# Patient Record
Sex: Female | Born: 1940 | Race: White | Hispanic: No | Smoking: Never smoker
Health system: Southern US, Community
[De-identification: ages and names within clinical notes are randomized; demographics above are authoritative.]

## PROBLEM LIST (undated history)

## (undated) DIAGNOSIS — R42 Dizziness and giddiness: Secondary | ICD-10-CM

## (undated) DIAGNOSIS — G459 Transient cerebral ischemic attack, unspecified: Secondary | ICD-10-CM

## (undated) DIAGNOSIS — E7800 Pure hypercholesterolemia, unspecified: Secondary | ICD-10-CM

## (undated) DIAGNOSIS — F419 Anxiety disorder, unspecified: Secondary | ICD-10-CM

## (undated) DIAGNOSIS — I10 Essential (primary) hypertension: Secondary | ICD-10-CM

## (undated) DIAGNOSIS — K859 Acute pancreatitis without necrosis or infection, unspecified: Secondary | ICD-10-CM

## (undated) DIAGNOSIS — E079 Disorder of thyroid, unspecified: Secondary | ICD-10-CM

## (undated) DIAGNOSIS — K56609 Unspecified intestinal obstruction, unspecified as to partial versus complete obstruction: Secondary | ICD-10-CM

## (undated) DIAGNOSIS — E119 Type 2 diabetes mellitus without complications: Secondary | ICD-10-CM

## (undated) HISTORY — PX: CHOLECYSTECTOMY: SHX55

## (undated) HISTORY — PX: OTHER SURGICAL HISTORY: SHX169

## (undated) HISTORY — PX: ABDOMINAL HYSTERECTOMY: SHX81

## (undated) HISTORY — PX: APPENDECTOMY: SHX54

## (undated) HISTORY — PX: TONSILLECTOMY: SUR1361

---

## 2010-03-17 ENCOUNTER — Ambulatory Visit: Payer: Self-pay | Admitting: Family Medicine

## 2010-03-17 ENCOUNTER — Inpatient Hospital Stay (HOSPITAL_COMMUNITY): Admission: EM | Admit: 2010-03-17 | Discharge: 2010-03-21 | Payer: Self-pay | Admitting: Emergency Medicine

## 2010-03-21 ENCOUNTER — Ambulatory Visit: Payer: Self-pay | Admitting: Psychiatry

## 2010-09-29 LAB — CBC
HCT: 32.1 % — ABNORMAL LOW (ref 36.0–46.0)
MCH: 30 pg (ref 26.0–34.0)
MCHC: 34.3 g/dL (ref 30.0–36.0)
MCV: 88.7 fL (ref 78.0–100.0)
MCV: 89.2 fL (ref 78.0–100.0)
MCV: 89.9 fL (ref 78.0–100.0)
Platelets: 176 10*3/uL (ref 150–400)
Platelets: 199 10*3/uL (ref 150–400)
Platelets: 213 10*3/uL (ref 150–400)
RBC: 3.36 MIL/uL — ABNORMAL LOW (ref 3.87–5.11)
RBC: 3.6 MIL/uL — ABNORMAL LOW (ref 3.87–5.11)
RDW: 12.9 % (ref 11.5–15.5)
RDW: 12.9 % (ref 11.5–15.5)
RDW: 13 % (ref 11.5–15.5)
WBC: 4.3 10*3/uL (ref 4.0–10.5)
WBC: 6.1 10*3/uL (ref 4.0–10.5)

## 2010-09-29 LAB — URINALYSIS, ROUTINE W REFLEX MICROSCOPIC
Glucose, UA: NEGATIVE mg/dL
Nitrite: NEGATIVE
Specific Gravity, Urine: 1.008 (ref 1.005–1.030)
pH: 5 (ref 5.0–8.0)

## 2010-09-29 LAB — BASIC METABOLIC PANEL
BUN: 12 mg/dL (ref 6–23)
BUN: 12 mg/dL (ref 6–23)
BUN: 12 mg/dL (ref 6–23)
CO2: 27 mEq/L (ref 19–32)
Chloride: 100 mEq/L (ref 96–112)
Chloride: 103 mEq/L (ref 96–112)
Chloride: 99 mEq/L (ref 96–112)
Creatinine, Ser: 1.1 mg/dL (ref 0.4–1.2)
Creatinine, Ser: 1.29 mg/dL — ABNORMAL HIGH (ref 0.4–1.2)
Creatinine, Ser: 1.46 mg/dL — ABNORMAL HIGH (ref 0.4–1.2)
GFR calc Af Amer: 59 mL/min — ABNORMAL LOW (ref >60)
GFR calc non Af Amer: 49 mL/min — ABNORMAL LOW (ref >60)
Potassium: 4.2 mEq/L (ref 3.5–5.1)

## 2010-09-29 LAB — CLOSTRIDIUM DIFFICILE EIA

## 2010-09-29 LAB — URINE CULTURE: Culture  Setup Time: 201109011720

## 2010-09-29 LAB — DIFFERENTIAL
Basophils Absolute: 0.1 10*3/uL (ref 0.0–0.1)
Basophils Relative: 1 % (ref 0–1)
Eosinophils Absolute: 0.3 10*3/uL (ref 0.0–0.7)
Eosinophils Relative: 5 % (ref 0–5)
Lymphocytes Relative: 34 % (ref 12–46)

## 2010-09-29 LAB — T3: T3, Total: 88.4 ng/dl (ref 80.0–204.0)

## 2010-09-29 LAB — VITAMIN B12: Vitamin B-12: 226 pg/mL (ref 211–911)

## 2010-09-29 LAB — T4, FREE: Free T4: 0.96 ng/dL (ref 0.80–1.80)

## 2010-09-29 LAB — GLUCOSE, CAPILLARY: Glucose-Capillary: 132 mg/dL — ABNORMAL HIGH (ref 70–99)

## 2010-09-29 LAB — T4: T4, Total: 7.2 ug/dL (ref 5.0–12.5)

## 2010-09-29 LAB — PROTIME-INR: Prothrombin Time: 13.7 seconds (ref 11.6–15.2)

## 2010-09-29 LAB — POCT CARDIAC MARKERS

## 2012-10-29 ENCOUNTER — Emergency Department (HOSPITAL_BASED_OUTPATIENT_CLINIC_OR_DEPARTMENT_OTHER)
Admission: EM | Admit: 2012-10-29 | Discharge: 2012-10-29 | Disposition: A | Discharge status: Home / Self Care | Payer: Medicare Other | Attending: Emergency Medicine | Admitting: Emergency Medicine

## 2012-10-29 ENCOUNTER — Emergency Department (HOSPITAL_BASED_OUTPATIENT_CLINIC_OR_DEPARTMENT_OTHER): Payer: Medicare Other

## 2012-10-29 ENCOUNTER — Encounter (HOSPITAL_BASED_OUTPATIENT_CLINIC_OR_DEPARTMENT_OTHER): Payer: Self-pay | Admitting: *Deleted

## 2012-10-29 DIAGNOSIS — F411 Generalized anxiety disorder: Secondary | ICD-10-CM | POA: Insufficient documentation

## 2012-10-29 DIAGNOSIS — Y9301 Activity, walking, marching and hiking: Secondary | ICD-10-CM | POA: Insufficient documentation

## 2012-10-29 DIAGNOSIS — R110 Nausea: Secondary | ICD-10-CM | POA: Insufficient documentation

## 2012-10-29 DIAGNOSIS — Y929 Unspecified place or not applicable: Secondary | ICD-10-CM | POA: Insufficient documentation

## 2012-10-29 DIAGNOSIS — E079 Disorder of thyroid, unspecified: Secondary | ICD-10-CM | POA: Insufficient documentation

## 2012-10-29 DIAGNOSIS — R51 Headache: Secondary | ICD-10-CM | POA: Insufficient documentation

## 2012-10-29 DIAGNOSIS — Z8639 Personal history of other endocrine, nutritional and metabolic disease: Secondary | ICD-10-CM | POA: Insufficient documentation

## 2012-10-29 DIAGNOSIS — R109 Unspecified abdominal pain: Secondary | ICD-10-CM | POA: Insufficient documentation

## 2012-10-29 DIAGNOSIS — R5381 Other malaise: Secondary | ICD-10-CM | POA: Insufficient documentation

## 2012-10-29 DIAGNOSIS — G8929 Other chronic pain: Secondary | ICD-10-CM | POA: Insufficient documentation

## 2012-10-29 DIAGNOSIS — IMO0002 Reserved for concepts with insufficient information to code with codable children: Secondary | ICD-10-CM | POA: Insufficient documentation

## 2012-10-29 DIAGNOSIS — I10 Essential (primary) hypertension: Secondary | ICD-10-CM | POA: Insufficient documentation

## 2012-10-29 DIAGNOSIS — Z79899 Other long term (current) drug therapy: Secondary | ICD-10-CM | POA: Insufficient documentation

## 2012-10-29 DIAGNOSIS — K439 Ventral hernia without obstruction or gangrene: Secondary | ICD-10-CM

## 2012-10-29 DIAGNOSIS — W1809XA Striking against other object with subsequent fall, initial encounter: Secondary | ICD-10-CM | POA: Insufficient documentation

## 2012-10-29 DIAGNOSIS — Z8673 Personal history of transient ischemic attack (TIA), and cerebral infarction without residual deficits: Secondary | ICD-10-CM | POA: Insufficient documentation

## 2012-10-29 DIAGNOSIS — Z862 Personal history of diseases of the blood and blood-forming organs and certain disorders involving the immune mechanism: Secondary | ICD-10-CM | POA: Insufficient documentation

## 2012-10-29 DIAGNOSIS — R42 Dizziness and giddiness: Secondary | ICD-10-CM

## 2012-10-29 DIAGNOSIS — Z8719 Personal history of other diseases of the digestive system: Secondary | ICD-10-CM | POA: Insufficient documentation

## 2012-10-29 DIAGNOSIS — R300 Dysuria: Secondary | ICD-10-CM | POA: Insufficient documentation

## 2012-10-29 HISTORY — DX: Transient cerebral ischemic attack, unspecified: G45.9

## 2012-10-29 HISTORY — DX: Pure hypercholesterolemia, unspecified: E78.00

## 2012-10-29 HISTORY — DX: Disorder of thyroid, unspecified: E07.9

## 2012-10-29 HISTORY — DX: Essential (primary) hypertension: I10

## 2012-10-29 HISTORY — DX: Dizziness and giddiness: R42

## 2012-10-29 HISTORY — DX: Anxiety disorder, unspecified: F41.9

## 2012-10-29 HISTORY — DX: Acute pancreatitis without necrosis or infection, unspecified: K85.90

## 2012-10-29 LAB — COMPREHENSIVE METABOLIC PANEL
ALT: 21 U/L (ref 0–35)
Alkaline Phosphatase: 72 U/L (ref 39–117)
CO2: 22 mEq/L (ref 19–32)
Chloride: 95 mEq/L — ABNORMAL LOW (ref 96–112)
GFR calc Af Amer: 64 mL/min — ABNORMAL LOW (ref >90)
GFR calc non Af Amer: 55 mL/min — ABNORMAL LOW (ref >90)
Glucose, Bld: 229 mg/dL — ABNORMAL HIGH (ref 70–99)
Potassium: 3.8 mEq/L (ref 3.5–5.1)
Sodium: 130 mEq/L — ABNORMAL LOW (ref 135–145)

## 2012-10-29 LAB — URINALYSIS, ROUTINE W REFLEX MICROSCOPIC
Glucose, UA: 250 mg/dL — AB
Hgb urine dipstick: NEGATIVE
Protein, ur: NEGATIVE mg/dL
Specific Gravity, Urine: 1.01 (ref 1.005–1.030)
pH: 5.5 (ref 5.0–8.0)

## 2012-10-29 LAB — CBC
MCHC: 34.3 g/dL (ref 30.0–36.0)
RDW: 13.1 % (ref 11.5–15.5)

## 2012-10-29 LAB — LIPASE, BLOOD: Lipase: 49 U/L (ref 11–59)

## 2012-10-29 LAB — TROPONIN I: Troponin I: <0.3 ng/mL (ref <0.30)

## 2012-10-29 LAB — GLUCOSE, CAPILLARY: Glucose-Capillary: 227 mg/dL — ABNORMAL HIGH (ref 70–99)

## 2012-10-29 MED ORDER — MECLIZINE HCL 25 MG PO TABS
25.0000 mg | ORAL_TABLET | Freq: Once | ORAL | Status: AC
  Administered 2012-10-29: 25 mg via ORAL
  Filled 2012-10-29: qty 1

## 2012-10-29 MED ORDER — IOHEXOL 300 MG/ML  SOLN
50.0000 mL | Freq: Once | INTRAMUSCULAR | Status: AC | PRN
  Administered 2012-10-29: 50 mL via ORAL

## 2012-10-29 MED ORDER — ONDANSETRON HCL 4 MG/2ML IJ SOLN
4.0000 mg | Freq: Once | INTRAMUSCULAR | Status: AC
  Administered 2012-10-29: 4 mg via INTRAVENOUS
  Filled 2012-10-29: qty 2

## 2012-10-29 MED ORDER — IOHEXOL 300 MG/ML  SOLN
100.0000 mL | Freq: Once | INTRAMUSCULAR | Status: AC | PRN
  Administered 2012-10-29: 100 mL via INTRAVENOUS

## 2012-10-29 MED ORDER — ONDANSETRON 4 MG PO TBDP: 4.0000 mg | ORAL_TABLET | Freq: Once | ORAL | Status: DC

## 2012-10-29 MED ORDER — GADOBENATE DIMEGLUMINE 529 MG/ML IV SOLN: 15.0000 mL | Freq: Once | INTRAVENOUS | Status: DC | PRN

## 2012-10-29 MED ORDER — SODIUM CHLORIDE 0.9 % IV BOLUS (SEPSIS)
500.0000 mL | Freq: Once | INTRAVENOUS | Status: AC
  Administered 2012-10-29: 500 mL via INTRAVENOUS

## 2012-10-29 NOTE — ED Notes (Signed)
Pt. Has had apple juice and was able to keep it down.   Pt. Up to restroom and is noted unsteady on her feet.  Pt. Reports she has vertigo.   Pt. Taken to restroom with steady just now.   Pt. Tolerated well.  Pt. Sons at bedside.

## 2012-10-29 NOTE — ED Provider Notes (Signed)
History     CSN: [id number]  Arrival date & time 10/29/12  1009   First MD Initiated Contact with Patient 10/29/12 1023      Chief Complaint  Patient presents with  . Dizziness    (Consider location/radiation/quality/duration/timing/severity/associated sxs/prior treatment) HPI  Patient presents complaining of worsening of chronic dizziness, dysuria, weakness, abdominal pain and back pain.  She is a poor historian.  She says she fell against a wall on her left side yesterday while walking.  She is nauseated and has not been eating well.  She has difficulty explaining exactly why she came to the ER today.    She has chronic vertigo and takes meclizine TID but says it is a little worse lately.  She has chronic abdominal pain and history of pancreatitis, with intermittent nausea, but says it is a little worse.    Past Medical History  Diagnosis Date  . Anxiety   . Hypertension   . Elevated cholesterol   . TIA (transient ischemic attack)   . Thyroid disease   . Pancreatitis   . Vertigo   Says they have tried to say she has diabetes but every time they give her medications it drops her sugar.   Past Surgical History  Procedure Laterality Date  . Abdominal hysterectomy    . Cholecystectomy    . Tonsillectomy    . Appendectomy      History  Substance Use Topics  . Smoking status: Never Smoker   . Smokeless tobacco: Never Used  . Alcohol Use: No    Review of Systems  Constitutional: Positive for fatigue.  HENT: Negative for neck pain.   Eyes: Negative for visual disturbance.  Respiratory: Negative for cough.   Cardiovascular: Negative for chest pain.  Gastrointestinal: Positive for nausea and abdominal pain. Negative for vomiting.  Genitourinary: Positive for dysuria.  Musculoskeletal: Positive for back pain.  Skin: Negative for rash.  Neurological: Positive for dizziness and headaches. Negative for syncope.    Allergies  Metformin and related  Home Medications    Current Outpatient Rx  Name  Route  Sig  Dispense  Refill  . ALPRAZolam (XANAX) 1 MG tablet   Oral   Take 1 mg by mouth at bedtime as needed for sleep.         . citalopram (CELEXA) 40 MG tablet   Oral   Take 40 mg by mouth daily.         . hydrOXYzine (ATARAX/VISTARIL) 25 MG tablet   Oral   Take 25 mg by mouth 3 (three) times daily as needed for itching.         . levothyroxine (SYNTHROID, LEVOTHROID) 100 MCG tablet   Oral   Take 100 mcg by mouth daily before breakfast.         . meclizine (ANTIVERT) 25 MG tablet   Oral   Take 25 mg by mouth 3 (three) times daily.         Marland Kitchen omeprazole (PRILOSEC) 20 MG capsule   Oral   Take 20 mg by mouth daily.         . simvastatin (ZOCOR) 80 MG tablet   Oral   Take 80 mg by mouth at bedtime.         . triamterene-hydrochlorothiazide (MAXZIDE-25) 37.5-25 MG per tablet   Oral   Take 1 tablet by mouth daily.           BP 156/65  Pulse 66  Temp(Src) 98.3 F (36.8 C) (Oral)  Resp 18  SpO2 100%  Physical Exam  Vitals reviewed. Constitutional: She is oriented to person, place, and time. She appears well-developed and well-nourished.  HENT:  Head: Normocephalic and atraumatic.  Mouth/Throat: Oropharynx is clear and moist.  Eyes: Pupils are equal, round, and reactive to light.  Cardiovascular: Normal rate and regular rhythm.   Pulmonary/Chest: Effort normal and breath sounds normal.  Abdominal: Soft.  Diffuse tenderness to palpation, no rebound  Musculoskeletal:  + tenderness to palpation over shoulder diffusely, no deformity  Neurological: She is alert and oriented to person, place, and time. No cranial nerve deficit.    ED Course  Procedures (including critical care time)  Labs Reviewed  GLUCOSE, CAPILLARY - Abnormal; Notable for the following:    Glucose-Capillary 227 (*)    All other components within normal limits  URINALYSIS, ROUTINE W REFLEX MICROSCOPIC - Abnormal; Notable for the following:     Glucose, UA 250 (*)    All other components within normal limits  COMPREHENSIVE METABOLIC PANEL - Abnormal; Notable for the following:    Sodium 130 (*)    Chloride 95 (*)    Glucose, Bld 229 (*)    Total Bilirubin 0.2 (*)    GFR calc non Af Amer 55 (*)    GFR calc Af Amer 64 (*)    All other components within normal limits  CBC - Abnormal; Notable for the following:    RBC 3.61 (*)    Hemoglobin 11.0 (*)    HCT 32.1 (*)    All other components within normal limits  LIPASE, BLOOD  TROPONIN I  LACTIC ACID, PLASMA   Dg Chest 2 View  10/29/2012  *RADIOLOGY REPORT*  Clinical Data: Dizziness.  Status post fall with a blow to the left shoulder.  CHEST - 2 VIEW  Comparison: PA and lateral chest 03/17/2010.  Findings: There is cardiomegaly without edema.  Lungs are clear. No pneumothorax or pleural effusion.  No focal bony abnormality.  IMPRESSION: Cardiomegaly without acute disease.   Original Report Authenticated By: Holley Dexter, M.D.    Mr Angiogram Head Wo Contrast  10/29/2012  *RADIOLOGY REPORT*  Clinical Data:  Dizziness.  Vertigo.  Elevated blood sugar. Headache.  Right arm pain.  Hypertensive patient with hypercholesterolemia.  MRI BRAIN WITH AND WITHOUT CONTRAST MRA HEAD WITHOUT CONTRAST MRA NECK WITHOUT AND WITH CONTRAST  Technique: Multiplanar, multiecho pulse sequences of the brain and surrounding structures were obtained according to standard protocol with and without intravenous contrast.  Angiographic images of the Circle of Willis were obtained using MRA technique without intravenous contrast.  Angiographic images of the neck were obtained using MRA technique without and with intravenous contrast.  Contrast:15 ml MultiHance.  Comparison:03/18/2010 MR brain.  MRI HEAD  Findings: No acute infarct.  No intracranial hemorrhage.  Mild small vessel disease type changes.  Prominent perivascular space right basal ganglia incidentally noted and unchanged.  No hydrocephalus.  No  intracranial mass or abnormal enhancement.  Cervical medullary junction unremarkable.  IMPRESSION: No acute abnormality.  Please see above.  MRA HEAD  Findings:Anterior circulation without medium or large size vessel significant stenosis or occlusion.  Left internal carotid artery petrous segment with mild ectasia posterior margin.  Fetal type origin of the right posterior cerebral artery.  Tiny bulge distal M1 segment right middle cerebral artery represents a vessel origin as seen on source sequence.  No discrete saccular aneurysm noted.  Right vertebral artery is dominant.  The course of the PICAs are not included  on the present exam.  Prominent AICAs.  Smooth narrowing of the basilar artery after the takeoff of the AICAs.  Mild bulbous appearance of the basilar tip without discrete aneurysm.  Moderate narrowing and irregularity of the left superior cerebellar artery.  Posterior cerebral artery branch vessel irregularity bilaterally.  IMPRESSION: Intracranial atherosclerotic type changes as noted above.  MRA NECK  Findings:Carotid bifurcation plaque with narrowing of the proximal internal carotid artery but without evidence of hemodynamically significant stenosis  Left vertebral artery arises directly from the aortic arch.  Right vertebral artery is dominant.  Mild irregularity of the vertebral arteries without high-grade stenosis.  IMPRESSION: No evidence of hemodynamically significant stenosis involving either carotid bifurcation.  Left vertebral artery arises directly from the aortic arch.  Right vertebral artery is dominant.  Mild irregularity of the vertebral arteries without high-grade stenosis.   Original Report Authenticated By: Lacy Duverney, M.D.    Mr Angiogram Neck W Wo Contrast  10/29/2012  *RADIOLOGY REPORT*  Clinical Data:  Dizziness.  Vertigo.  Elevated blood sugar. Headache.  Right arm pain.  Hypertensive patient with hypercholesterolemia.  MRI BRAIN WITH AND WITHOUT CONTRAST MRA HEAD WITHOUT  CONTRAST MRA NECK WITHOUT AND WITH CONTRAST  Technique: Multiplanar, multiecho pulse sequences of the brain and surrounding structures were obtained according to standard protocol with and without intravenous contrast.  Angiographic images of the Circle of Willis were obtained using MRA technique without intravenous contrast.  Angiographic images of the neck were obtained using MRA technique without and with intravenous contrast.  Contrast:15 ml MultiHance.  Comparison:03/18/2010 MR brain.  MRI HEAD  Findings: No acute infarct.  No intracranial hemorrhage.  Mild small vessel disease type changes.  Prominent perivascular space right basal ganglia incidentally noted and unchanged.  No hydrocephalus.  No intracranial mass or abnormal enhancement.  Cervical medullary junction unremarkable.  IMPRESSION: No acute abnormality.  Please see above.  MRA HEAD  Findings:Anterior circulation without medium or large size vessel significant stenosis or occlusion.  Left internal carotid artery petrous segment with mild ectasia posterior margin.  Fetal type origin of the right posterior cerebral artery.  Tiny bulge distal M1 segment right middle cerebral artery represents a vessel origin as seen on source sequence.  No discrete saccular aneurysm noted.  Right vertebral artery is dominant.  The course of the PICAs are not included on the present exam.  Prominent AICAs.  Smooth narrowing of the basilar artery after the takeoff of the AICAs.  Mild bulbous appearance of the basilar tip without discrete aneurysm.  Moderate narrowing and irregularity of the left superior cerebellar artery.  Posterior cerebral artery branch vessel irregularity bilaterally.  IMPRESSION: Intracranial atherosclerotic type changes as noted above.  MRA NECK  Findings:Carotid bifurcation plaque with narrowing of the proximal internal carotid artery but without evidence of hemodynamically significant stenosis  Left vertebral artery arises directly from the aortic  arch.  Right vertebral artery is dominant.  Mild irregularity of the vertebral arteries without high-grade stenosis.  IMPRESSION: No evidence of hemodynamically significant stenosis involving either carotid bifurcation.  Left vertebral artery arises directly from the aortic arch.  Right vertebral artery is dominant.  Mild irregularity of the vertebral arteries without high-grade stenosis.   Original Report Authenticated By: Lacy Duverney, M.D.    Mr Laqueta Jean ZO Contrast  10/29/2012  *RADIOLOGY REPORT*  Clinical Data:  Dizziness.  Vertigo.  Elevated blood sugar. Headache.  Right arm pain.  Hypertensive patient with hypercholesterolemia.  MRI BRAIN WITH AND WITHOUT CONTRAST MRA HEAD  WITHOUT CONTRAST MRA NECK WITHOUT AND WITH CONTRAST  Technique: Multiplanar, multiecho pulse sequences of the brain and surrounding structures were obtained according to standard protocol with and without intravenous contrast.  Angiographic images of the Circle of Willis were obtained using MRA technique without intravenous contrast.  Angiographic images of the neck were obtained using MRA technique without and with intravenous contrast.  Contrast:15 ml MultiHance.  Comparison:03/18/2010 MR brain.  MRI HEAD  Findings: No acute infarct.  No intracranial hemorrhage.  Mild small vessel disease type changes.  Prominent perivascular space right basal ganglia incidentally noted and unchanged.  No hydrocephalus.  No intracranial mass or abnormal enhancement.  Cervical medullary junction unremarkable.  IMPRESSION: No acute abnormality.  Please see above.  MRA HEAD  Findings:Anterior circulation without medium or large size vessel significant stenosis or occlusion.  Left internal carotid artery petrous segment with mild ectasia posterior margin.  Fetal type origin of the right posterior cerebral artery.  Tiny bulge distal M1 segment right middle cerebral artery represents a vessel origin as seen on source sequence.  No discrete saccular aneurysm  noted.  Right vertebral artery is dominant.  The course of the PICAs are not included on the present exam.  Prominent AICAs.  Smooth narrowing of the basilar artery after the takeoff of the AICAs.  Mild bulbous appearance of the basilar tip without discrete aneurysm.  Moderate narrowing and irregularity of the left superior cerebellar artery.  Posterior cerebral artery branch vessel irregularity bilaterally.  IMPRESSION: Intracranial atherosclerotic type changes as noted above.  MRA NECK  Findings:Carotid bifurcation plaque with narrowing of the proximal internal carotid artery but without evidence of hemodynamically significant stenosis  Left vertebral artery arises directly from the aortic arch.  Right vertebral artery is dominant.  Mild irregularity of the vertebral arteries without high-grade stenosis.  IMPRESSION: No evidence of hemodynamically significant stenosis involving either carotid bifurcation.  Left vertebral artery arises directly from the aortic arch.  Right vertebral artery is dominant.  Mild irregularity of the vertebral arteries without high-grade stenosis.   Original Report Authenticated By: Lacy Duverney, M.D.    Ct Abdomen Pelvis W Contrast  10/29/2012  *RADIOLOGY REPORT*  Clinical Data: Right-sided abdominal and back pain.  Nausea. History pancreatitis.  Previous cholecystectomy and appendectomy.  CT ABDOMEN AND PELVIS WITH CONTRAST  Technique:  Multidetector CT imaging of the abdomen and pelvis was performed following the standard protocol during bolus administration of intravenous contrast.  Contrast: OMNIPAQUE IOHEXOL 300 MG/ML  SOLN  Comparison: None.  Findings: The pancreas, liver, spleen, adrenal glands, and kidneys are normal and appearance. Prior cholecystectomy noted, however, there is no evidence of biliary ductal dilatation.  No evidence of peripancreatic inflammatory changes.  No evidence of hydronephrosis.  No evidence of soft tissue masses or lymphadenopathy.  Prior  hysterectomy noted.  No evidence of inflammatory process or abnormal fluid collections within the abdomen or pelvis.  Diverticulosis is seen involving the sigmoid colon, however there is no evidence of diverticulitis.  A small epigastric midline ventral hernia is seen containing only omental fat.  This is unchanged.  No evidence of herniated bowel loops or bowel obstruction.  No suspicious bone lesions identified.  IMPRESSION:  1.  No CT evidence of pancreatitis or other acute findings. 2.  Diverticulosis.  No radiographic evidence of diverticulitis. 3.  Stable small epigastric ventral hernia containing only fat.   Original Report Authenticated By: Myles Rosenthal, M.D.    Dg Shoulder Left  10/29/2012  *RADIOLOGY REPORT*  Clinical  Data: Status post fall.  Left shoulder pain.  LEFT SHOULDER - 2+ VIEW  Comparison: None.  Findings: The humerus is located and the acromioclavicular joint is intact.  There is acromioclavicular degenerative change.  Imaged lung parenchyma and ribs are unremarkable.  IMPRESSION: No acute finding.  Acromioclavicular osteoarthritis.   Original Report Authenticated By: Holley Dexter, M.D.      Date: 10/29/2012  Rate: 65  Rhythm: normal sinus rhythm  QRS Axis: normal  Intervals: normal  ST/T Wave abnormalities: nonspecific T wave changes  Conduction Disutrbances:none  Narrative Interpretation:   Old EKG Reviewed: unchanged  1. Vertigo   2. Hernia, ventral       MDM  Patient afebrile, with normal vital signs, without significant laboratory abnormalities or imaging abnormalities.  No signs of pancreatitis, bowel incarceration, CVA.  She is tolerating PO fluids Advised follow up with primary care provider.         Ardyth Gal, MD 10/29/12 [phone]

## 2012-10-29 NOTE — Discharge Instructions (Signed)
Hernia A hernia occurs when an internal organ pushes out through a weak spot in the abdominal wall. Hernias most commonly occur in the groin and around the navel. Hernias often can be pushed back into place (reduced). Most hernias tend to get worse over time. Some abdominal hernias can get stuck in the opening (irreducible or incarcerated hernia) and cannot be reduced. An irreducible abdominal hernia which is tightly squeezed into the opening is at risk for impaired blood supply (strangulated hernia). A strangulated hernia is a medical emergency. Because of the risk for an irreducible or strangulated hernia, surgery may be recommended to repair a hernia. CAUSES   Heavy lifting.  Prolonged coughing.  Straining to have a bowel movement.  A cut (incision) made during an abdominal surgery. HOME CARE INSTRUCTIONS   Bed rest is not required. You may continue your normal activities.  Avoid lifting more than 10 pounds (4.5 kg) or straining.  Cough gently. If you are a smoker it is best to stop. Even the best hernia repair can break down with the continual strain of coughing. Even if you do not have your hernia repaired, a cough will continue to aggravate the problem.  Do not wear anything tight over your hernia. Do not try to keep it in with an outside bandage or truss. These can damage abdominal contents if they are trapped within the hernia sac.  Eat a normal diet.  Avoid constipation. Straining over long periods of time will increase hernia size and encourage breakdown of repairs. If you cannot do this with diet alone, stool softeners may be used. SEEK IMMEDIATE MEDICAL CARE IF:   You have a fever.  You develop increasing abdominal pain.  You feel nauseous or vomit.  Your hernia is stuck outside the abdomen, looks discolored, feels hard, or is tender.  You have any changes in your bowel habits or in the hernia that are unusual for you.  You have increased pain or swelling around the  hernia.  You cannot push the hernia back in place by applying gentle pressure while lying down. MAKE SURE YOU:   Understand these instructions.  Will watch your condition.  Will get help right away if you are not doing well or get worse. Document Released: 07/03/2005 Document Revised: 09/25/2011 Document Reviewed: 02/20/2008 [employer] Patient Information 2013 [employer].  

## 2012-10-29 NOTE — ED Notes (Addendum)
Pt stated she felt like she had a fever, temperature obtained: 98.1. Pt drinking her liquid contrast drink. Pt made aware that she has to finish her drinks before she can go to CT scan.

## 2012-10-29 NOTE — ED Notes (Signed)
Pt escorted to the bathroom. Pt's bed changed as pt soiled herself. Pt's gait is wobbly. Fall risk precautions initiated. Pt completed one of two liquid contrast drinks.

## 2012-10-29 NOTE — ED Notes (Signed)
Patient transported to MRI 

## 2012-10-29 NOTE — ED Notes (Signed)
Patient transported to X-ray 

## 2012-10-29 NOTE — ED Provider Notes (Signed)
I saw and evaluated the patient, reviewed the resident's note and I agree with the findings and plan.  Difficult historian.  Reports chronic vertigo x 8years that is slightly worse as well as epigastric abdominal pain since Oct 2013 she states is from pancreatitis. Nausea, no vomiting. No chest pain or SOB. Poor effort on neuro exam. CN 2-12 intact, no nystagmus, 5/5 strength throughout, no ataxia on finger to nose, no nystagmus.  Epigastric TTP with reducible ventral hernia. Imaging obtained given poor historian with poor effort on neuro exam. Able to ambulate.  Glynn Octave, MD 10/29/12 [phone]

## 2012-10-29 NOTE — ED Notes (Signed)
Patient states she has an eight year history of vertigo, over the last 24 hours her dizziness is worse.  States she takes antivert 3 times a day, last dose 4am today.  States she fell last night and has pain in her left arm.  Pt also c/o burning with urination and has not urinated this morning.  C/O chronic pain from her pancreatitis since oct, 2013.  Pt has multiple c/o of chronic medical problems, but states today her dizziness is worse than normal.

## 2012-12-02 ENCOUNTER — Encounter (HOSPITAL_BASED_OUTPATIENT_CLINIC_OR_DEPARTMENT_OTHER): Payer: Self-pay | Admitting: *Deleted

## 2012-12-02 ENCOUNTER — Emergency Department (HOSPITAL_BASED_OUTPATIENT_CLINIC_OR_DEPARTMENT_OTHER)
Admission: EM | Admit: 2012-12-02 | Discharge: 2012-12-02 | Disposition: A | Discharge status: Home / Self Care | Payer: Medicare Other | Attending: Emergency Medicine | Admitting: Emergency Medicine

## 2012-12-02 DIAGNOSIS — I10 Essential (primary) hypertension: Secondary | ICD-10-CM | POA: Insufficient documentation

## 2012-12-02 DIAGNOSIS — Z8719 Personal history of other diseases of the digestive system: Secondary | ICD-10-CM | POA: Insufficient documentation

## 2012-12-02 DIAGNOSIS — E079 Disorder of thyroid, unspecified: Secondary | ICD-10-CM | POA: Insufficient documentation

## 2012-12-02 DIAGNOSIS — Z79899 Other long term (current) drug therapy: Secondary | ICD-10-CM | POA: Insufficient documentation

## 2012-12-02 DIAGNOSIS — R5381 Other malaise: Secondary | ICD-10-CM | POA: Insufficient documentation

## 2012-12-02 DIAGNOSIS — R1013 Epigastric pain: Secondary | ICD-10-CM | POA: Insufficient documentation

## 2012-12-02 DIAGNOSIS — Z8673 Personal history of transient ischemic attack (TIA), and cerebral infarction without residual deficits: Secondary | ICD-10-CM | POA: Insufficient documentation

## 2012-12-02 DIAGNOSIS — R109 Unspecified abdominal pain: Secondary | ICD-10-CM

## 2012-12-02 DIAGNOSIS — Z90710 Acquired absence of both cervix and uterus: Secondary | ICD-10-CM | POA: Insufficient documentation

## 2012-12-02 DIAGNOSIS — N390 Urinary tract infection, site not specified: Secondary | ICD-10-CM | POA: Insufficient documentation

## 2012-12-02 DIAGNOSIS — F411 Generalized anxiety disorder: Secondary | ICD-10-CM | POA: Insufficient documentation

## 2012-12-02 DIAGNOSIS — R110 Nausea: Secondary | ICD-10-CM | POA: Insufficient documentation

## 2012-12-02 DIAGNOSIS — R300 Dysuria: Secondary | ICD-10-CM | POA: Insufficient documentation

## 2012-12-02 DIAGNOSIS — E7800 Pure hypercholesterolemia, unspecified: Secondary | ICD-10-CM | POA: Insufficient documentation

## 2012-12-02 DIAGNOSIS — Z9089 Acquired absence of other organs: Secondary | ICD-10-CM | POA: Insufficient documentation

## 2012-12-02 DIAGNOSIS — R42 Dizziness and giddiness: Secondary | ICD-10-CM | POA: Insufficient documentation

## 2012-12-02 LAB — CBC WITH DIFFERENTIAL/PLATELET
Basophils Relative: 1 % (ref 0–1)
Eosinophils Absolute: 0.4 10*3/uL (ref 0.0–0.7)
MCH: 30.8 pg (ref 26.0–34.0)
MCHC: 35.3 g/dL (ref 30.0–36.0)
Neutrophils Relative %: 54 % (ref 43–77)
Platelets: 212 10*3/uL (ref 150–400)
RBC: 3.73 MIL/uL — ABNORMAL LOW (ref 3.87–5.11)

## 2012-12-02 LAB — COMPREHENSIVE METABOLIC PANEL WITH GFR
ALT: 22 U/L (ref 0–35)
AST: 26 U/L (ref 0–37)
Albumin: 3.9 g/dL (ref 3.5–5.2)
Alkaline Phosphatase: 75 U/L (ref 39–117)
BUN: 16 mg/dL (ref 6–23)
CO2: 25 meq/L (ref 19–32)
Calcium: 9.8 mg/dL (ref 8.4–10.5)
Chloride: 92 meq/L — ABNORMAL LOW (ref 96–112)
Creatinine, Ser: 1.2 mg/dL — ABNORMAL HIGH (ref 0.50–1.10)
GFR calc Af Amer: 51 mL/min — ABNORMAL LOW
GFR calc non Af Amer: 44 mL/min — ABNORMAL LOW
Glucose, Bld: 226 mg/dL — ABNORMAL HIGH (ref 70–99)
Potassium: 4.4 meq/L (ref 3.5–5.1)
Sodium: 130 meq/L — ABNORMAL LOW (ref 135–145)
Total Bilirubin: 0.2 mg/dL — ABNORMAL LOW (ref 0.3–1.2)
Total Protein: 7.1 g/dL (ref 6.0–8.3)

## 2012-12-02 LAB — URINALYSIS, ROUTINE W REFLEX MICROSCOPIC
Bilirubin Urine: NEGATIVE
Ketones, ur: NEGATIVE mg/dL
Nitrite: NEGATIVE
Protein, ur: NEGATIVE mg/dL
Urobilinogen, UA: 0.2 mg/dL (ref 0.0–1.0)

## 2012-12-02 LAB — URINE MICROSCOPIC-ADD ON

## 2012-12-02 LAB — LIPASE, BLOOD: Lipase: 76 U/L — ABNORMAL HIGH (ref 11–59)

## 2012-12-02 LAB — LACTIC ACID, PLASMA: Lactic Acid, Venous: 1.6 mmol/L (ref 0.5–2.2)

## 2012-12-02 MED ORDER — OXYCODONE-ACETAMINOPHEN 5-325 MG PO TABS: 1.0000 | ORAL_TABLET | Freq: Three times a day (TID) | ORAL | Status: DC | PRN

## 2012-12-02 MED ORDER — SODIUM CHLORIDE 0.9 % IV BOLUS (SEPSIS)
500.0000 mL | Freq: Once | INTRAVENOUS | Status: AC
  Administered 2012-12-02: 1000 mL via INTRAVENOUS

## 2012-12-02 MED ORDER — ONDANSETRON HCL 4 MG/2ML IJ SOLN
4.0000 mg | Freq: Once | INTRAMUSCULAR | Status: AC
  Administered 2012-12-02: 4 mg via INTRAVENOUS
  Filled 2012-12-02: qty 2

## 2012-12-02 MED ORDER — CEPHALEXIN 500 MG PO CAPS: 500.0000 mg | ORAL_CAPSULE | Freq: Four times a day (QID) | ORAL | Status: DC

## 2012-12-02 MED ORDER — MECLIZINE HCL 25 MG PO TABS
25.0000 mg | ORAL_TABLET | Freq: Once | ORAL | Status: AC
  Administered 2012-12-02: 25 mg via ORAL
  Filled 2012-12-02: qty 1

## 2012-12-02 MED ORDER — DEXTROSE 5 % IV SOLN
1.0000 g | Freq: Once | INTRAVENOUS | Status: AC
  Administered 2012-12-02: 1 g via INTRAVENOUS
  Filled 2012-12-02: qty 10

## 2012-12-02 NOTE — ED Provider Notes (Signed)
History     CSN: [id number]  Arrival date & time 12/02/12  1054   First MD Initiated Contact with Patient 12/02/12 1118      Chief Complaint  Patient presents with  . Abdominal Pain    Patient is a 72 y.o. female presenting with abdominal pain. The history is provided by the patient and a relative.  Abdominal Pain Pain location:  Epigastric Pain quality: aching   Pain radiates to:  Does not radiate Pain severity:  Moderate Onset quality:  Gradual Timing:  Constant Progression:  Worsening Chronicity:  Recurrent Context: awakening from sleep   Relieved by:  Nothing Worsened by:  Palpation Associated symptoms: dysuria, fatigue and nausea   Associated symptoms: no chest pain, no fever and no shortness of breath     Past Medical History  Diagnosis Date  . Anxiety   . Hypertension   . Elevated cholesterol   . TIA (transient ischemic attack)   . Thyroid disease   . Pancreatitis   . Vertigo     Past Surgical History  Procedure Laterality Date  . Abdominal hysterectomy    . Cholecystectomy    . Tonsillectomy    . Appendectomy    . Vertigo      History reviewed. No pertinent family history.  History  Substance Use Topics  . Smoking status: Never Smoker   . Smokeless tobacco: Never Used  . Alcohol Use: No    OB History   Grav Para Term Preterm Abortions TAB SAB Ect Mult Living                  Review of Systems  Constitutional: Positive for fatigue. Negative for fever.  Respiratory: Negative for shortness of breath.   Cardiovascular: Negative for chest pain.  Gastrointestinal: Positive for nausea and abdominal pain.  Genitourinary: Positive for dysuria.  Musculoskeletal: Negative for back pain.  Neurological: Positive for dizziness.       Reports vertigo   All other systems reviewed and are negative.    Allergies  Metformin and related  Home Medications   Current Outpatient Rx  Name  Route  Sig  Dispense  Refill  . ALPRAZolam (XANAX) 1 MG  tablet   Oral   Take 1 mg by mouth at bedtime as needed for sleep.         . citalopram (CELEXA) 40 MG tablet   Oral   Take 40 mg by mouth daily.         . hydrOXYzine (ATARAX/VISTARIL) 25 MG tablet   Oral   Take 25 mg by mouth 3 (three) times daily as needed for itching.         . levothyroxine (SYNTHROID, LEVOTHROID) 100 MCG tablet   Oral   Take 100 mcg by mouth daily before breakfast.         . meclizine (ANTIVERT) 25 MG tablet   Oral   Take 25 mg by mouth 3 (three) times daily.         Marland Kitchen omeprazole (PRILOSEC) 20 MG capsule   Oral   Take 20 mg by mouth daily.         . simvastatin (ZOCOR) 80 MG tablet   Oral   Take 80 mg by mouth at bedtime.         . triamterene-hydrochlorothiazide (MAXZIDE-25) 37.5-25 MG per tablet   Oral   Take 1 tablet by mouth daily.           BP 132/78  Pulse 72  Temp(Src) 97.9 F (36.6 C) (Oral)  Resp 20  SpO2 98%  Physical Exam CONSTITUTIONAL: Well developed/well nourished HEAD: Normocephalic/atraumatic EYES: EOMI/PERRL ENMT: Mucous membranes moist NECK: supple no meningeal signs SPINE:entire spine nontender, No bruising/crepitance/stepoffs noted to spine CV: S1/S2 noted, no murmurs/rubs/gallops noted LUNGS: Lungs are clear to auscultation bilaterally, no apparent distress ABDOMEN: soft, mild epigastric tenderness noted, no rebound or guarding GU:no cva tenderness NEURO: Pt is awake/alert, moves all extremitiesx4. No focal motor deficits noted in upper/lower extremities EXTREMITIES: pulses normal, full ROM SKIN: warm, color normal PSYCH: no abnormalities of mood noted  ED Course  Procedures   Labs Reviewed  CBC WITH DIFFERENTIAL - Abnormal; Notable for the following:    RBC 3.73 (*)    Hemoglobin 11.5 (*)    HCT 32.6 (*)    Eosinophils Relative 7 (*)    All other components within normal limits  COMPREHENSIVE METABOLIC PANEL  LIPASE, BLOOD  LACTIC ACID, PLASMA  URINALYSIS, ROUTINE W REFLEX MICROSCOPIC    1:50 PM Pt here for multiple complaints She reports "pancreas hurting" and feeling like she has vertigo She had some confusion during one of my interviews but this resolved I spoke to her PCP  Dr Mayford Knife, who reports she will frequently become confused with vertigo and this has been an ongoing issue.   Pt is now more awake/alert but still reports vertigo (she recently had extensive CT abd/pelvis and neuroimaging for similar presentation) uti and dehydration noted and she has been given IV fluids and rocephin  2:08 PM Pt improved She has no focal motor deficits, and she moves freely in the bed She did have difficulty walking Patient is with both of her sons.  Patient and family feel comfortable with discharge as they report this is very common for her.  She denies any recent syncope or significant head injury recently.  She reports chronic vertigo.  I did speak about possible admission but she reports she would feel comfortable for d/c home.  Patient/family request short course of pain meds (will monitor for falls) and see her PCP this week  MDM  Nursing notes including past medical history and social history reviewed and considered in documentation Labs/vital reviewed and considered Previous records reviewed and considered - recent ED visit noted        Date: 12/02/2012  Rate: 64  Rhythm: normal sinus rhythm  QRS Axis: normal  Intervals: normal  ST/T Wave abnormalities: nonspecific ST changes  Conduction Disutrbances:none  Narrative Interpretation:   Old EKG Reviewed: unchanged    Joya Gaskins, MD 12/02/12 1411

## 2012-12-02 NOTE — ED Notes (Signed)
Pt states she woke up at 0600 this morning with her "pancreas hurting" pt holding right upper quad abdomen states nauseated no vomiting the pain also radiates through to her right upper back pt also reports she has vertigo and it seems to be "making everything worse today"

## 2012-12-02 NOTE — ED Notes (Addendum)
Attempted to walk pt . Pt not able to walk. Doctor notified

## 2012-12-02 NOTE — ED Notes (Signed)
Called Dr. Kym Groom for Dr. Bebe Shaggy in [place], [place]

## 2012-12-02 NOTE — ED Notes (Signed)
Patient changed into gown, waist up. 

## 2012-12-02 NOTE — ED Notes (Signed)
Pt sat up and scooted self to end of stretcher w/o assist-sons x 2 in room assisting pt with redress

## 2012-12-03 LAB — URINE CULTURE

## 2012-12-04 NOTE — ED Notes (Signed)
Post ED Visit - Positive Culture Follow-up  Culture report reviewed by antimicrobial stewardship pharmacist: []  Wes Dulaney, Pharm.D., BCPS []  Celedonio Miyamoto, Pharm.D., BCPS [x]  Georgina Pillion, Pharm.D., BCPS []  [place], [address].D., BCPS, AAHIVP []  Estella Husk, Pharm.D., BCPS, AAHIV  Positive urine culture No further patient follow-up is required at this time.  Larena Sox 12/04/2012, 3:35 PM

## 2013-07-11 ENCOUNTER — Emergency Department (HOSPITAL_BASED_OUTPATIENT_CLINIC_OR_DEPARTMENT_OTHER): Payer: Medicare Other

## 2013-07-11 ENCOUNTER — Emergency Department (HOSPITAL_BASED_OUTPATIENT_CLINIC_OR_DEPARTMENT_OTHER)
Admission: EM | Admit: 2013-07-11 | Discharge: 2013-07-11 | Disposition: A | Discharge status: Other Acute Inpatient Hospital | Payer: Medicare Other | Attending: Emergency Medicine | Admitting: Emergency Medicine

## 2013-07-11 ENCOUNTER — Encounter (HOSPITAL_BASED_OUTPATIENT_CLINIC_OR_DEPARTMENT_OTHER): Payer: Self-pay | Admitting: Emergency Medicine

## 2013-07-11 DIAGNOSIS — W19XXXA Unspecified fall, initial encounter: Secondary | ICD-10-CM

## 2013-07-11 DIAGNOSIS — R05 Cough: Secondary | ICD-10-CM | POA: Insufficient documentation

## 2013-07-11 DIAGNOSIS — E7800 Pure hypercholesterolemia, unspecified: Secondary | ICD-10-CM | POA: Insufficient documentation

## 2013-07-11 DIAGNOSIS — R112 Nausea with vomiting, unspecified: Secondary | ICD-10-CM | POA: Insufficient documentation

## 2013-07-11 DIAGNOSIS — F411 Generalized anxiety disorder: Secondary | ICD-10-CM | POA: Insufficient documentation

## 2013-07-11 DIAGNOSIS — K859 Acute pancreatitis without necrosis or infection, unspecified: Secondary | ICD-10-CM | POA: Insufficient documentation

## 2013-07-11 DIAGNOSIS — R197 Diarrhea, unspecified: Secondary | ICD-10-CM | POA: Insufficient documentation

## 2013-07-11 DIAGNOSIS — E079 Disorder of thyroid, unspecified: Secondary | ICD-10-CM | POA: Insufficient documentation

## 2013-07-11 DIAGNOSIS — R059 Cough, unspecified: Secondary | ICD-10-CM | POA: Insufficient documentation

## 2013-07-11 DIAGNOSIS — I10 Essential (primary) hypertension: Secondary | ICD-10-CM | POA: Insufficient documentation

## 2013-07-11 DIAGNOSIS — E860 Dehydration: Secondary | ICD-10-CM | POA: Insufficient documentation

## 2013-07-11 DIAGNOSIS — R109 Unspecified abdominal pain: Secondary | ICD-10-CM | POA: Insufficient documentation

## 2013-07-11 DIAGNOSIS — J111 Influenza due to unidentified influenza virus with other respiratory manifestations: Secondary | ICD-10-CM

## 2013-07-11 DIAGNOSIS — R42 Dizziness and giddiness: Secondary | ICD-10-CM | POA: Insufficient documentation

## 2013-07-11 LAB — URINALYSIS, ROUTINE W REFLEX MICROSCOPIC
Glucose, UA: 250 mg/dL — AB
Ketones, ur: NEGATIVE mg/dL
Leukocytes, UA: NEGATIVE
Protein, ur: NEGATIVE mg/dL

## 2013-07-11 LAB — COMPREHENSIVE METABOLIC PANEL
AST: 23 U/L (ref 0–37)
CO2: 25 mEq/L (ref 19–32)
Calcium: 8.5 mg/dL (ref 8.4–10.5)
Creatinine, Ser: 1 mg/dL (ref 0.50–1.10)
GFR calc Af Amer: 63 mL/min — ABNORMAL LOW (ref >90)
GFR calc non Af Amer: 55 mL/min — ABNORMAL LOW (ref >90)
Glucose, Bld: 215 mg/dL — ABNORMAL HIGH (ref 70–99)

## 2013-07-11 LAB — CBC
Hemoglobin: 10.2 g/dL — ABNORMAL LOW (ref 12.0–15.0)
MCH: 29.8 pg (ref 26.0–34.0)
MCHC: 34.9 g/dL (ref 30.0–36.0)

## 2013-07-11 LAB — CG4 I-STAT (LACTIC ACID): Lactic Acid, Venous: 1.54 mmol/L (ref 0.5–2.2)

## 2013-07-11 LAB — LIPASE, BLOOD: Lipase: 44 U/L (ref 11–59)

## 2013-07-11 MED ORDER — IOHEXOL 300 MG/ML  SOLN
100.0000 mL | Freq: Once | INTRAMUSCULAR | Status: AC | PRN
  Administered 2013-07-11: 100 mL via INTRAVENOUS

## 2013-07-11 MED ORDER — ONDANSETRON HCL 4 MG/2ML IJ SOLN
4.0000 mg | Freq: Once | INTRAMUSCULAR | Status: AC
  Administered 2013-07-11: 4 mg via INTRAVENOUS
  Filled 2013-07-11: qty 2

## 2013-07-11 MED ORDER — SODIUM CHLORIDE 0.9 % IV BOLUS (SEPSIS)
1000.0000 mL | Freq: Once | INTRAVENOUS | Status: AC
  Administered 2013-07-11: 1000 mL via INTRAVENOUS

## 2013-07-11 MED ORDER — MORPHINE SULFATE 4 MG/ML IJ SOLN
4.0000 mg | Freq: Once | INTRAMUSCULAR | Status: AC
Start: 2013-07-11 — End: 2013-07-11
  Administered 2013-07-11: 4 mg via INTRAVENOUS
  Filled 2013-07-11: qty 1

## 2013-07-11 NOTE — ED Notes (Signed)
Patient states she has a five day history of flu like symptoms.  States today while walking down the steps, she feel down four steps landing on her left hip.  Now has pain in the left hip area.

## 2013-07-11 NOTE — ED Notes (Signed)
Returned from 2nd trip to [employer]

## 2013-07-11 NOTE — ED Provider Notes (Signed)
CSN: [id number]     Arrival date & time 07/11/13  1052 History   First MD Initiated Contact with Patient 07/11/13 1245     Chief Complaint  Patient presents with  . Fall  . Influenza   (Consider location/radiation/quality/duration/timing/severity/associated sxs/prior Treatment) Patient is a 72 y.o. female presenting with fall and flu symptoms. The history is provided by the patient.  Fall This is a new problem. The current episode started 1 to 2 hours ago. Episode frequency: once. The problem has not changed since onset.Pertinent negatives include no chest pain, no abdominal pain and no shortness of breath. Nothing aggravates the symptoms. Nothing relieves the symptoms.  Influenza Presenting symptoms: cough, diarrhea, nausea, rhinorrhea and vomiting   Presenting symptoms: no fever and no shortness of breath   Associated symptoms: chills     Past Medical History  Diagnosis Date  . Anxiety   . Hypertension   . Elevated cholesterol   . TIA (transient ischemic attack)   . Thyroid disease   . Pancreatitis   . Vertigo    Past Surgical History  Procedure Laterality Date  . Abdominal hysterectomy    . Cholecystectomy    . Tonsillectomy    . Appendectomy    . Vertigo     No family history on file. History  Substance Use Topics  . Smoking status: Never Smoker   . Smokeless tobacco: Never Used  . Alcohol Use: No   OB History   Grav Para Term Preterm Abortions TAB SAB Ect Mult Living                 Review of Systems  Constitutional: Positive for chills. Negative for fever.  HENT: Positive for rhinorrhea and sinus pressure.   Respiratory: Positive for cough. Negative for shortness of breath.   Cardiovascular: Negative for chest pain and leg swelling.  Gastrointestinal: Positive for nausea, vomiting and diarrhea. Negative for abdominal pain.  All other systems reviewed and are negative.    Allergies  Metformin and related  Home Medications   Current Outpatient Rx   Name  Route  Sig  Dispense  Refill  . ALPRAZolam (XANAX) 1 MG tablet   Oral   Take 1 mg by mouth at bedtime as needed for sleep.         . citalopram (CELEXA) 40 MG tablet   Oral   Take 40 mg by mouth daily.         . hydrOXYzine (ATARAX/VISTARIL) 25 MG tablet   Oral   Take 25 mg by mouth 3 (three) times daily as needed for itching.         . levothyroxine (SYNTHROID, LEVOTHROID) 100 MCG tablet   Oral   Take 100 mcg by mouth daily before breakfast.         . meclizine (ANTIVERT) 25 MG tablet   Oral   Take 25 mg by mouth 3 (three) times daily.         Marland Kitchen omeprazole (PRILOSEC) 20 MG capsule   Oral   Take 20 mg by mouth daily.         Marland Kitchen oxyCODONE-acetaminophen (PERCOCET/ROXICET) 5-325 MG per tablet   Oral   Take 1 tablet by mouth every 8 (eight) hours as needed for pain (do not take with tylenol).   10 tablet   0   . simvastatin (ZOCOR) 80 MG tablet   Oral   Take 80 mg by mouth at bedtime.         Marland Kitchen  triamterene-hydrochlorothiazide (MAXZIDE-25) 37.5-25 MG per tablet   Oral   Take 1 tablet by mouth daily.         . cephALEXin (KEFLEX) 500 MG capsule   Oral   Take 1 capsule (500 mg total) by mouth 4 (four) times daily.   28 capsule   0    BP 119/62  Pulse 74  Temp(Src) 98.4 F (36.9 C) (Oral)  Resp 17  Ht 5\' 2"  (1.575 m)  Wt 141 lb (63.957 kg)  BMI 25.78 kg/m2  SpO2 97% Physical Exam  Nursing note and vitals reviewed. Constitutional: She is oriented to person, place, and time. She appears well-developed and well-nourished. No distress.  HENT:  Head: Normocephalic and atraumatic.  Eyes: EOM are normal. Pupils are equal, round, and reactive to light.  Neck: Normal range of motion. Neck supple.  Cardiovascular: Normal rate and regular rhythm.  Exam reveals no friction rub.   No murmur heard. Pulmonary/Chest: Effort normal and breath sounds normal. No respiratory distress. She has no wheezes. She has no rales.  Abdominal: Soft. She exhibits no  distension. There is no tenderness. There is no rebound.  Musculoskeletal: She exhibits no edema.       Left hip: She exhibits decreased range of motion, tenderness, bony tenderness (lateral) and swelling (lower buttock contusion).       Right knee: She exhibits decreased range of motion and swelling. She exhibits no effusion, no ecchymosis, no deformity and no laceration. Tenderness (diffuse) found. No medial joint line and no lateral joint line tenderness noted.  Neurological: She is alert and oriented to person, place, and time.  Skin: She is not diaphoretic.    ED Course  Procedures (including critical care time) Labs Review Labs Reviewed  CBC - Abnormal; Notable for the following:    RBC 3.42 (*)    Hemoglobin 10.2 (*)    HCT 29.2 (*)    All other components within normal limits  URINALYSIS, ROUTINE W REFLEX MICROSCOPIC - Abnormal; Notable for the following:    Glucose, UA 250 (*)    All other components within normal limits  COMPREHENSIVE METABOLIC PANEL - Abnormal; Notable for the following:    Sodium 126 (*)    Chloride 88 (*)    Glucose, Bld 215 (*)    Total Bilirubin 0.2 (*)    GFR calc non Af Amer 55 (*)    GFR calc Af Amer 63 (*)    All other components within normal limits  LIPASE, BLOOD  CG4 I-STAT (LACTIC ACID)   Imaging Review Dg Chest 2 View  07/11/2013   CLINICAL DATA:  Status post fall.  Dizziness.  EXAM: CHEST  2 VIEW  COMPARISON:  PA and lateral chest 10/29/2012 and 03/17/2010.  FINDINGS: There is mild cardiomegaly without edema. The lungs are clear. No pneumothorax or pleural fluid is identified. No focal bony abnormality is seen.  IMPRESSION: Mild cardiomegaly without acute disease.   Electronically Signed   By: Drusilla Kanner M.D.   On: 07/11/2013 14:51   Dg Hip Bilateral W/pelvis  07/11/2013   CLINICAL DATA:  Status post fall.  Hip pain.  EXAM: BILATERAL HIP WITH PELVIS - 4+ VIEW  COMPARISON:  None.  FINDINGS: Imaged bones, joints and soft tissues  appear normal.  IMPRESSION: Negative exam.   Electronically Signed   By: Drusilla Kanner M.D.   On: 07/11/2013 14:52   Dg Femur Left  07/11/2013   CLINICAL DATA:  Status post fall.  Left leg pain.  EXAM: LEFT FEMUR - 2 VIEW  COMPARISON:  None.  FINDINGS: No acute bony or joint abnormality is identified. There is some degenerative change about the left knee. Soft tissue structures are unremarkable.  IMPRESSION: No acute finding.   Electronically Signed   By: Drusilla Kanner M.D.   On: 07/11/2013 14:53   Ct Head Wo Contrast  07/11/2013   CLINICAL DATA:  Fall, right parietal headache  EXAM: CT HEAD WITHOUT CONTRAST  TECHNIQUE: Contiguous axial images were obtained from the base of the skull through the vertex without intravenous contrast.  COMPARISON:  MRI brain dated 10/29/2012  FINDINGS: No evidence of parenchymal hemorrhage or extra-axial fluid collection. No mass lesion, mass effect, or midline shift.  No CT evidence of acute infarction.  Mild subcortical white matter and periventricular small vessel ischemic changes. Hypodensity in the right basal ganglia corresponds to a prominent VR space on prior MRI.  Cerebral volume is within normal limits.  No ventriculomegaly.  The visualized paranasal sinuses are essentially clear. The mastoid air cells are unopacified.  No evidence of calvarial fracture.  IMPRESSION: No evidence of acute intracranial abnormality.   Electronically Signed   By: Charline Bills M.D.   On: 07/11/2013 14:24   Dg Knee Complete 4 Views Right  07/11/2013   CLINICAL DATA:  Status post fall.  Right knee pain.  EXAM: RIGHT KNEE - COMPLETE 4+ VIEW  COMPARISON:  None.  FINDINGS: There is no acute bony or joint abnormality. No joint effusion is present. Patellofemoral degenerative change is identified.  IMPRESSION: No acute finding.  Patellofemoral osteoarthritis.   Electronically Signed   By: Drusilla Kanner M.D.   On: 07/11/2013 14:53    EKG Interpretation   None       MDM    1. Dehydration   2. Fall, initial encounter   3. Influenza    72 year old female presents with a fall. She's had 5 days of flulike symptoms with vomiting diarrhea, congestion, cough. She was walking down some steps today and slipped on ice. She went on her hip. She did not hit her head or lose consciousness. She has mild abdominal pain, she states is her chronic pancreatitis. She has a left lower buttock contusion 4 she landed, but no gross deformities. Also some right knee pain without any gross deformities. Will x-ray some of her lower extremities. Also check labs. Labs show hyponatremia, hypochloremia. I'm concerned for dehydration secondary to her flulike symptoms. Imaging negative for fracture.  CT negative for acute pancreatitis. With her dehydration, will admit to [facility] for fluids. Dr. Harvie Junior accepting.  Dagmar Hait, MD 07/11/13 [phone]

## 2013-07-11 NOTE — ED Notes (Signed)
[facility] here for transport due to [facility] transport 2+hrs availability

## 2013-07-11 NOTE — ED Notes (Signed)
Pt and family first advised EDP [facility] as hospital choice-now request change to [place]-EDP back in to talk with pt and family

## 2015-02-14 ENCOUNTER — Encounter (HOSPITAL_BASED_OUTPATIENT_CLINIC_OR_DEPARTMENT_OTHER): Payer: Self-pay | Admitting: *Deleted

## 2015-02-14 ENCOUNTER — Emergency Department (HOSPITAL_BASED_OUTPATIENT_CLINIC_OR_DEPARTMENT_OTHER): Payer: Medicare Other

## 2015-02-14 ENCOUNTER — Emergency Department (HOSPITAL_BASED_OUTPATIENT_CLINIC_OR_DEPARTMENT_OTHER)
Admission: EM | Admit: 2015-02-14 | Discharge: 2015-02-14 | Disposition: A | Discharge status: Home / Self Care | Payer: Medicare Other | Attending: Emergency Medicine | Admitting: Emergency Medicine

## 2015-02-14 DIAGNOSIS — I10 Essential (primary) hypertension: Secondary | ICD-10-CM | POA: Insufficient documentation

## 2015-02-14 DIAGNOSIS — Z792 Long term (current) use of antibiotics: Secondary | ICD-10-CM | POA: Diagnosis not present

## 2015-02-14 DIAGNOSIS — E079 Disorder of thyroid, unspecified: Secondary | ICD-10-CM | POA: Diagnosis not present

## 2015-02-14 DIAGNOSIS — E780 Pure hypercholesterolemia: Secondary | ICD-10-CM | POA: Insufficient documentation

## 2015-02-14 DIAGNOSIS — E119 Type 2 diabetes mellitus without complications: Secondary | ICD-10-CM | POA: Insufficient documentation

## 2015-02-14 DIAGNOSIS — Z8673 Personal history of transient ischemic attack (TIA), and cerebral infarction without residual deficits: Secondary | ICD-10-CM | POA: Diagnosis not present

## 2015-02-14 DIAGNOSIS — F419 Anxiety disorder, unspecified: Secondary | ICD-10-CM | POA: Insufficient documentation

## 2015-02-14 DIAGNOSIS — Z79899 Other long term (current) drug therapy: Secondary | ICD-10-CM | POA: Diagnosis not present

## 2015-02-14 DIAGNOSIS — R1032 Left lower quadrant pain: Secondary | ICD-10-CM

## 2015-02-14 DIAGNOSIS — Z8719 Personal history of other diseases of the digestive system: Secondary | ICD-10-CM | POA: Insufficient documentation

## 2015-02-14 DIAGNOSIS — N838 Other noninflammatory disorders of ovary, fallopian tube and broad ligament: Secondary | ICD-10-CM

## 2015-02-14 HISTORY — DX: Type 2 diabetes mellitus without complications: E11.9

## 2015-02-14 LAB — CBC WITH DIFFERENTIAL/PLATELET
BASOS PCT: 0 % (ref 0–1)
Basophils Absolute: 0 10*3/uL (ref 0.0–0.1)
Eosinophils Absolute: 0.5 10*3/uL (ref 0.0–0.7)
Eosinophils Relative: 5 % (ref 0–5)
HCT: 31.6 % — ABNORMAL LOW (ref 36.0–46.0)
Hemoglobin: 10.8 g/dL — ABNORMAL LOW (ref 12.0–15.0)
LYMPHS PCT: 23 % (ref 12–46)
Lymphs Abs: 2.1 10*3/uL (ref 0.7–4.0)
MCH: 30.2 pg (ref 26.0–34.0)
MCHC: 34.2 g/dL (ref 30.0–36.0)
MCV: 88.3 fL (ref 78.0–100.0)
MONO ABS: 1 10*3/uL (ref 0.1–1.0)
Monocytes Relative: 11 % (ref 3–12)
Neutro Abs: 5.8 10*3/uL (ref 1.7–7.7)
Neutrophils Relative %: 61 % (ref 43–77)
Platelets: 269 10*3/uL (ref 150–400)
RBC: 3.58 MIL/uL — AB (ref 3.87–5.11)
RDW: 13.4 % (ref 11.5–15.5)
WBC: 9.4 10*3/uL (ref 4.0–10.5)

## 2015-02-14 LAB — URINE MICROSCOPIC-ADD ON

## 2015-02-14 LAB — URINALYSIS, ROUTINE W REFLEX MICROSCOPIC
Bilirubin Urine: NEGATIVE
GLUCOSE, UA: NEGATIVE mg/dL
Hgb urine dipstick: NEGATIVE
Ketones, ur: NEGATIVE mg/dL
NITRITE: NEGATIVE
PH: 5.5 (ref 5.0–8.0)
Protein, ur: NEGATIVE mg/dL
Specific Gravity, Urine: 1.01 (ref 1.005–1.030)
UROBILINOGEN UA: 0.2 mg/dL (ref 0.0–1.0)

## 2015-02-14 MED ORDER — MORPHINE SULFATE 4 MG/ML IJ SOLN
4.0000 mg | INTRAMUSCULAR | Status: DC | PRN
Start: 2015-02-14 — End: 2015-02-14
  Administered 2015-02-14 (×2): 2 mg via INTRAVENOUS
  Filled 2015-02-14 (×2): qty 1

## 2015-02-14 MED ORDER — IOHEXOL 300 MG/ML  SOLN
100.0000 mL | Freq: Once | INTRAMUSCULAR | Status: AC | PRN
  Administered 2015-02-14: 100 mL via INTRAVENOUS

## 2015-02-14 MED ORDER — ONDANSETRON HCL 4 MG/2ML IJ SOLN
4.0000 mg | Freq: Once | INTRAMUSCULAR | Status: AC
  Administered 2015-02-14: 4 mg via INTRAVENOUS
  Filled 2015-02-14: qty 2

## 2015-02-14 MED ORDER — HYDROCODONE-ACETAMINOPHEN 5-325 MG PO TABS: 2.0000 | ORAL_TABLET | ORAL | Status: DC | PRN

## 2015-02-14 NOTE — ED Notes (Signed)
Patient offered morphine.  Patient refused.  States "I don't want pain medicine unless I have to have it".

## 2015-02-14 NOTE — Discharge Instructions (Signed)
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 Follow-up with GYN immediately to discuss options for treatment.  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  Abdominal Pain Many things can cause abdominal pain. Usually, abdominal pain is not caused by a disease and will improve without treatment. It can often be observed and treated at home. Your health care provider will do a physical exam and possibly order blood tests and X-rays to help determine the seriousness of your pain. However, in many cases, more time must pass before a clear cause of the pain can be found. Before that point, your health care provider may not know if you need more testing or further treatment. HOME CARE INSTRUCTIONS  Monitor your abdominal pain for any changes. The following actions may help to alleviate any discomfort you are experiencing:  Only take over-the-counter or prescription medicines as directed by your health care provider.  Do not take laxatives unless directed to do so by your health care provider.  Try a clear liquid diet (broth, tea, or water) as directed by your health care provider. Slowly move to a bland diet as tolerated. SEEK MEDICAL CARE IF:  You have unexplained abdominal pain.  You have abdominal pain associated with nausea or diarrhea.  You have pain when you urinate or have a bowel movement.  You experience abdominal pain that wakes you in the night.  You have abdominal pain that is worsened or improved by eating food.  You have abdominal pain that is worsened with eating fatty foods.  You have a fever. SEEK IMMEDIATE MEDICAL CARE IF:   Your pain does not go away within 2 hours.  You keep throwing up (vomiting).  Your pain is felt only in portions of the abdomen, such as the right side or the left lower portion of the abdomen.  You pass bloody or black tarry stools. MAKE SURE YOU:  Understand these instructions.   Will watch your condition.   Will get help  right away if you are not doing well or get worse.  Document Released: 04/12/2005 Document Revised: 07/08/2013 Document Reviewed: 03/12/2013 [facility] Patient Information 2015 [place], [place]. This information is not intended to replace advice given to you by your health care provider. Make sure you discuss any questions you have with your health care provider.

## 2015-02-14 NOTE — ED Notes (Addendum)
Pt c/o left lower abd pain with movt  x 3 hrs

## 2015-02-14 NOTE — ED Notes (Signed)
Pt requesting on " half" of the ordering dose

## 2015-02-14 NOTE — ED Notes (Signed)
Pt given other half of ordering dose

## 2015-02-15 LAB — COMPREHENSIVE METABOLIC PANEL
ALK PHOS: 91 U/L (ref 38–126)
ALT: 27 U/L (ref 14–54)
AST: 35 U/L (ref 15–41)
Albumin: 3.5 g/dL (ref 3.5–5.0)
Anion gap: 10 (ref 5–15)
BUN: 27 mg/dL — ABNORMAL HIGH (ref 6–20)
CO2: 23 mmol/L (ref 22–32)
Calcium: 8.9 mg/dL (ref 8.9–10.3)
Chloride: 93 mmol/L — ABNORMAL LOW (ref 101–111)
Creatinine, Ser: 0.49 mg/dL (ref 0.44–1.00)
GLUCOSE: 165 mg/dL — AB (ref 65–99)
POTASSIUM: 4.6 mmol/L (ref 3.5–5.1)
SODIUM: 126 mmol/L — AB (ref 135–145)
Total Bilirubin: 0.4 mg/dL (ref 0.3–1.2)
Total Protein: 6.7 g/dL (ref 6.5–8.1)

## 2015-02-17 NOTE — ED Provider Notes (Signed)
CSN: [id number]     Arrival date & time 02/14/15  1027 History   First MD Initiated Contact with Patient 02/14/15 1034     Chief Complaint  Patient presents with  . Abdominal Pain     HPI  She presents for evaluation of left lower abdominal pain. States that she feels that she had "another hernia" she has an area that she states "I can feel a hernia there. She was a finger just adjacent to her left umbilicus complains of pain starting yesterday. No nausea or vomiting. Denies any additional symptoms. Known supraumbilical hernia.  Past Medical History  Diagnosis Date  . Anxiety   . Hypertension   . Elevated cholesterol   . TIA (transient ischemic attack)   . Thyroid disease   . Pancreatitis   . Vertigo   . Diabetes mellitus without complication    Past Surgical History  Procedure Laterality Date  . Abdominal hysterectomy    . Cholecystectomy    . Tonsillectomy    . Appendectomy    . Vertigo     History reviewed. No pertinent family history. History  Substance Use Topics  . Smoking status: Never Smoker   . Smokeless tobacco: Never Used  . Alcohol Use: No   OB History    No data available     Review of Systems  Constitutional: Negative for fever, chills, diaphoresis, appetite change and fatigue.  HENT: Negative for mouth sores, sore throat and trouble swallowing.   Eyes: Negative for visual disturbance.  Respiratory: Negative for cough, chest tightness, shortness of breath and wheezing.   Cardiovascular: Negative for chest pain.  Gastrointestinal: Positive for abdominal pain. Negative for nausea, vomiting, diarrhea and abdominal distention.  Endocrine: Negative for polydipsia, polyphagia and polyuria.  Genitourinary: Negative for dysuria, frequency and hematuria.  Musculoskeletal: Negative for gait problem.  Skin: Negative for color change, pallor and rash.  Neurological: Negative for dizziness, syncope, light-headedness and headaches.  Hematological: Does not  bruise/bleed easily.  Psychiatric/Behavioral: Negative for behavioral problems and confusion.      Allergies  Metformin and related  Home Medications   Prior to Admission medications   Medication Sig Start Date End Date Taking? Authorizing Provider  ALPRAZolam Prudy Feeler) 1 MG tablet Take 1 mg by mouth at bedtime as needed for sleep.    Historical Provider, MD  cephALEXin (KEFLEX) 500 MG capsule Take 1 capsule (500 mg total) by mouth 4 (four) times daily. 12/02/12   Zadie Rhine, MD  citalopram (CELEXA) 40 MG tablet Take 40 mg by mouth daily.    Historical Provider, MD  HYDROcodone-acetaminophen (NORCO/VICODIN) 5-325 MG per tablet Take 2 tablets by mouth every 4 (four) hours as needed. 02/14/15   Rolland Porter, MD  hydrOXYzine (ATARAX/VISTARIL) 25 MG tablet Take 25 mg by mouth 3 (three) times daily as needed for itching.    Historical Provider, MD  levothyroxine (SYNTHROID, LEVOTHROID) 100 MCG tablet Take 100 mcg by mouth daily before breakfast.    Historical Provider, MD  meclizine (ANTIVERT) 25 MG tablet Take 25 mg by mouth 3 (three) times daily.    Historical Provider, MD  omeprazole (PRILOSEC) 20 MG capsule Take 20 mg by mouth daily.    Historical Provider, MD  oxyCODONE-acetaminophen (PERCOCET/ROXICET) 5-325 MG per tablet Take 1 tablet by mouth every 8 (eight) hours as needed for pain (do not take with tylenol). 12/02/12   Zadie Rhine, MD  simvastatin (ZOCOR) 80 MG tablet Take 80 mg by mouth at bedtime.    Historical  Provider, MD  triamterene-hydrochlorothiazide (MAXZIDE-25) 37.5-25 MG per tablet Take 1 tablet by mouth daily.    Historical Provider, MD   BP 140/50 mmHg  Pulse 73  Temp(Src) 98.5 F (36.9 C) (Oral)  Resp 18  Ht  (1.575 m)  Wt 172 lb (78.019 kg)  BMI 31.45 kg/m2  SpO2 99% Physical Exam  Constitutional: She is oriented to person, place, and time. She appears well-developed and well-nourished. No distress.  HENT:  Head: Normocephalic.  Eyes: Conjunctivae are  normal. Pupils are equal, round, and reactive to light. No scleral icterus.  Neck: Normal range of motion. Neck supple. No thyromegaly present.  Cardiovascular: Normal rate and regular rhythm.  Exam reveals no gallop and no friction rub.   No murmur heard. Pulmonary/Chest: Effort normal and breath sounds normal. No respiratory distress. She has no wheezes. She has no rales.  Abdominal: Soft. Bowel sounds are normal. She exhibits no distension. There is no tenderness. There is no rebound.    Musculoskeletal: Normal range of motion.  Neurological: She is alert and oriented to person, place, and time.  Skin: Skin is warm and dry. No rash noted.  Psychiatric: She has a normal mood and affect. Her behavior is normal.    ED Course  Procedures (including critical care time) Labs Review Labs Reviewed  URINALYSIS, ROUTINE W REFLEX MICROSCOPIC (NOT AT [facility]) - Abnormal; Notable for the following:    Leukocytes, UA TRACE (*)    All other components within normal limits  CBC WITH DIFFERENTIAL/PLATELET - Abnormal; Notable for the following:    RBC 3.58 (*)    Hemoglobin 10.8 (*)    HCT 31.6 (*)    All other components within normal limits  COMPREHENSIVE METABOLIC PANEL - Abnormal; Notable for the following:    Sodium 126 (*)    Chloride 93 (*)    Glucose, Bld 165 (*)    BUN 27 (*)    All other components within normal limits  URINE MICROSCOPIC-ADD ON    Imaging Review No results found.   EKG Interpretation None      MDM   Final diagnoses:  LLQ pain  Ovarian mass    I discussed the findings of CT scan with her. This was done in the presence of her son. She has an apparent ovarian mass. I do not think this is the source of her pain. Her pain seems to be clearly from her abdominal wall as she is tender to even superficial palpation. However no palpable abdominal wall mass or hernia. No hernia inferior to the umbilicus on her CT scan. She has a small ventral supra umbilical hernia.  She was able to reiterate to me that her understanding that she would need to see OB/GYN to discuss further diagnosis and treatment. Son was able to reiterate this to me as well.    Rolland Porter, MD 02/17/15 [phone]

## 2015-03-25 ENCOUNTER — Ambulatory Visit: Payer: Medicare Other | Attending: Gynecologic Oncology | Admitting: Gynecologic Oncology

## 2015-03-25 ENCOUNTER — Encounter: Payer: Self-pay | Admitting: Gynecologic Oncology

## 2015-03-25 VITALS — BP 121/51 | HR 64 | Temp 98.4°F | Resp 18 | Ht 62.0 in | Wt 171.7 lb

## 2015-03-25 DIAGNOSIS — R1900 Intra-abdominal and pelvic swelling, mass and lump, unspecified site: Secondary | ICD-10-CM | POA: Insufficient documentation

## 2015-03-25 NOTE — Patient Instructions (Addendum)
Please follow-up in 6 months or if you have any discomfort or concerns sooner.  Please call closer to the date to schedule your appointment. Will check CA 125 at that visit.  Ovarian Cyst An ovarian cyst is a fluid-filled sac that forms on an ovary. The ovaries are small organs that produce eggs in women. Various types of cysts can form on the ovaries. Most are not cancerous. Many do not cause problems, and they often go away on their own. Some may cause symptoms and require treatment. Common types of ovarian cysts include:  Functional cysts--These cysts may occur every month during the menstrual cycle. This is normal. The cysts usually go away with the next menstrual cycle if the woman does not get pregnant. Usually, there are no symptoms with a functional cyst.  Endometrioma cysts--These cysts form from the tissue that lines the uterus. They are also called "chocolate cysts" because they become filled with blood that turns brown. This type of cyst can cause pain in the lower abdomen during intercourse and with your menstrual period.  Cystadenoma cysts--This type develops from the cells on the outside of the ovary. These cysts can get very big and cause lower abdomen pain and pain with intercourse. This type of cyst can twist on itself, cut off its blood supply, and cause severe pain. It can also easily rupture and cause a lot of pain.  Dermoid cysts--This type of cyst is sometimes found in both ovaries. These cysts may contain different kinds of body tissue, such as skin, teeth, hair, or cartilage. They usually do not cause symptoms unless they get very big.  Theca lutein cysts--These cysts occur when too much of a certain hormone (human chorionic gonadotropin) is produced and overstimulates the ovaries to produce an egg. This is most common after procedures used to assist with the conception of a baby (in vitro fertilization). CAUSES   Fertility drugs can cause a condition in which multiple large  cysts are formed on the ovaries. This is called ovarian hyperstimulation syndrome.  A condition called polycystic ovary syndrome can cause hormonal imbalances that can lead to nonfunctional ovarian cysts. SIGNS AND SYMPTOMS  Many ovarian cysts do not cause symptoms. If symptoms are present, they may include:  Pelvic pain or pressure.  Pain in the lower abdomen.  Pain during sexual intercourse.  Increasing girth (swelling) of the abdomen.  Abnormal menstrual periods.  Increasing pain with menstrual periods.  Stopping having menstrual periods without being pregnant. DIAGNOSIS  These cysts are commonly found during a routine or annual pelvic exam. Tests may be ordered to find out more about the cyst. These tests may include:  Ultrasound.  X-ray of the pelvis.  CT scan.  MRI.  Blood tests. TREATMENT  Many ovarian cysts go away on their own without treatment. Your health care provider may want to check your cyst regularly for 2-3 months to see if it changes. For women in menopause, it is particularly important to monitor a cyst closely because of the higher rate of ovarian cancer in menopausal women. When treatment is needed, it may include any of the following:  A procedure to drain the cyst (aspiration). This may be done using a long needle and ultrasound. It can also be done through a laparoscopic procedure. This involves using a thin, lighted tube with a tiny camera on the end (laparoscope) inserted through a small incision.  Surgery to remove the whole cyst. This may be done using laparoscopic surgery or an open surgery  involving a larger incision in the lower abdomen.  Hormone treatment or birth control pills. These methods are sometimes used to help dissolve a cyst. HOME CARE INSTRUCTIONS   Only take over-the-counter or prescription medicines as directed by your health care provider.  Follow up with your health care provider as directed.  Get regular pelvic exams and  Pap tests. SEEK MEDICAL CARE IF:   Your periods are late, irregular, or painful, or they stop.  Your pelvic pain or abdominal pain does not go away.  Your abdomen becomes larger or swollen.  You have pressure on your bladder or trouble emptying your bladder completely.  You have pain during sexual intercourse.  You have feelings of fullness, pressure, or discomfort in your stomach.  You lose weight for no apparent reason.  You feel generally ill.  You become constipated.  You lose your appetite.  You develop acne.  You have an increase in body and facial hair.  You are gaining weight, without changing your exercise and eating habits.  You think you are pregnant. SEEK IMMEDIATE MEDICAL CARE IF:   You have increasing abdominal pain.  You feel sick to your stomach (nauseous), and you throw up (vomit).  You develop a fever that comes on suddenly.  You have abdominal pain during a bowel movement.  Your menstrual periods become heavier than usual. MAKE SURE YOU:  Understand these instructions.  Will watch your condition.  Will get help right away if you are not doing well or get worse. Document Released: 07/03/2005 Document Revised: 07/08/2013 Document Reviewed: 03/10/2013 [facility] Patient Information 2015 [place], [place]. This information is not intended to replace advice given to you by your health care provider. Make sure you discuss any questions you have with your health care provider.

## 2015-03-25 NOTE — Progress Notes (Signed)
Consult Note: Gyn-Onc  Consult was requested by Dr. Mayford Knife for the evaluation of Alicia Bean 74 y.o. female  CC:  Chief Complaint  Patient presents with  . Ovarian Mass    New Patient    Assessment/Plan:  Ms. Alicia Bean is a 74 y.o.  With a 8x9cm left adnexal mass CA 125 is 30.  >nam declines examination or consideration of any form of surgery.  Her faith informs her that this is not cancer and will not be a problem. She is not interested in surgery because "it makes a problem worse".  She was counseled on the possibility that this mass may increase in size and cause discomfort, or may be an early malignancy. I requested that she f/u in 3 months but [name] does not wish to return sooner than 6 months unless she has symptoms Will repeat CA 125 at the next visit.  HPI: Ms. Alicia Bean is a 74 y.o.   G4P3 lmp 50 years ago.  S/p Hysterectomy for an ectopic pregnancy 50 years ago.  Alicia Bean  was feeling fatigued and the subsequent anemia  evaluation included CT abd/pelvis 02/14/2015 IMPRESSION: 8 x 9 x 7.5 cm left ovarian mass/ cyst suspicious for neoplasm. Gynecologic consultation is recommended. No evidence of ascites or peritoneal/omental abnormality.  No acute abnormalities identified.  Unchanged small -moderate supraumbilical ventral hernia containing Fat.  Feeling well.  CA 125 = 30.  Review of Systems:  Constitutional  Feels well, fatigue has resolved Cardiovascular  No chest pain, shortness of breath, or edema  Pulmonary  No cough or wheeze.  Gastro Intestinal  No nausea, vomitting, or diarrhoea. Occasional difficulty swallowing. No bright red blood per rectum, no abdominal pain, change in bowel movement, or constipation no bloating or early satiety Genito Urinary  No frequency, urgency, dysuria,no vaginal bleeding Musculo Skeletal  No myalgia, arthralgia, joint swelling or pain  Neurologic  No weakness, numbness, change in gait,  Psychology   No depression, anxiety, insomnia.    Current Meds:  Outpatient Encounter Prescriptions as of 03/25/2015  Medication Sig  . ACCU-CHEK AVIVA PLUS test strip   . ACCU-CHEK SOFTCLIX LANCETS lancets   . ALPRAZolam (XANAX) 1 MG tablet Take 1 mg by mouth at bedtime as needed for sleep.  Marland Kitchen atenolol (TENORMIN) 25 MG tablet   . citalopram (CELEXA) 40 MG tablet Take 40 mg by mouth daily.  . CORTISPORIN-TC 3.09-16-08-0.5 MG/ML otic suspension   . glimepiride (AMARYL) 2 MG tablet   . HYDROcodone-acetaminophen (NORCO/VICODIN) 5-325 MG per tablet Take 2 tablets by mouth every 4 (four) hours as needed.  . hydrOXYzine (ATARAX/VISTARIL) 25 MG tablet Take 25 mg by mouth 3 (three) times daily as needed for itching.  . levothyroxine (SYNTHROID, LEVOTHROID) 100 MCG tablet Take 100 mcg by mouth daily before breakfast.  . meclizine (ANTIVERT) 25 MG tablet Take 25 mg by mouth 3 (three) times daily.  . memantine (NAMENDA) 5 MG tablet   . mupirocin ointment (BACTROBAN) 2 %   . omeprazole (PRILOSEC) 40 MG capsule   . oxyCODONE-acetaminophen (PERCOCET/ROXICET) 5-325 MG per tablet Take 1 tablet by mouth every 8 (eight) hours as needed for pain (do not take with tylenol).  . predniSONE (DELTASONE) 20 MG tablet   . simvastatin (ZOCOR) 80 MG tablet Take 80 mg by mouth at bedtime.  . triamcinolone cream (KENALOG) 0.1 %   . triamterene-hydrochlorothiazide (MAXZIDE-25) 37.5-25 MG per tablet Take 1 tablet by mouth daily.  . [DISCONTINUED] cephALEXin (KEFLEX) 500 MG capsule Take  1 capsule (500 mg total) by mouth 4 (four) times daily. (Patient not taking: Reported on 03/25/2015)  . [DISCONTINUED] omeprazole (PRILOSEC) 20 MG capsule Take 20 mg by mouth daily.   No facility-administered encounter medications on file as of 03/25/2015.    Allergy:  Allergies  Allergen Reactions  . Metformin And Related     Social Hx:   Social History   Social History  . Marital Status: Widowed    Spouse Name: N/A  . Number of Children:  N/A  . Years of Education: N/A   Occupational History  . Not on file.   Social History Main Topics  . Smoking status: Never Smoker   . Smokeless tobacco: Never Used  . Alcohol Use: No  . Drug Use: Not on file  . Sexual Activity: Not Currently   Other Topics Concern  . Not on file   Social History Narrative  Accompanied by her son.  Past Surgical Hx:  Past Surgical History  Procedure Laterality Date  . Abdominal hysterectomy    . Cholecystectomy    . Tonsillectomy    . Appendectomy    . Vertigo      Past Medical Hx:  Past Medical History  Diagnosis Date  . Anxiety   . Hypertension   . Elevated cholesterol   . TIA (transient ischemic attack)   . Thyroid disease   . Pancreatitis   . Vertigo   . Diabetes mellitus without complication     Past Gynecological History:  G4P3, menarche 48 with regular heavy menses.  Hysterectomy for ectopic pregnancy over 50 years ago  No h/o abn pap.  Family Hx: Sister with esophogeal cancer, sister with lung cancer  Physical Exam: WD in NAD Taj Behrman adamantly declined examination.  Laurette Schimke, MD, PhD 03/25/2015, 1:18 PM

## 2015-05-11 ENCOUNTER — Encounter: Payer: Self-pay | Admitting: *Deleted

## 2015-05-17 ENCOUNTER — Encounter: Payer: Medicare Other | Admitting: Obstetrics and Gynecology

## 2015-11-04 ENCOUNTER — Encounter (HOSPITAL_BASED_OUTPATIENT_CLINIC_OR_DEPARTMENT_OTHER): Payer: Self-pay | Admitting: *Deleted

## 2015-11-04 ENCOUNTER — Emergency Department (HOSPITAL_BASED_OUTPATIENT_CLINIC_OR_DEPARTMENT_OTHER)
Admission: EM | Admit: 2015-11-04 | Discharge: 2015-11-04 | Disposition: A | Discharge status: Home / Self Care | Payer: Medicare Other | Attending: Emergency Medicine | Admitting: Emergency Medicine

## 2015-11-04 ENCOUNTER — Emergency Department (HOSPITAL_BASED_OUTPATIENT_CLINIC_OR_DEPARTMENT_OTHER): Payer: Medicare Other

## 2015-11-04 DIAGNOSIS — Z8719 Personal history of other diseases of the digestive system: Secondary | ICD-10-CM | POA: Insufficient documentation

## 2015-11-04 DIAGNOSIS — E7800 Pure hypercholesterolemia, unspecified: Secondary | ICD-10-CM | POA: Insufficient documentation

## 2015-11-04 DIAGNOSIS — R531 Weakness: Secondary | ICD-10-CM | POA: Insufficient documentation

## 2015-11-04 DIAGNOSIS — E079 Disorder of thyroid, unspecified: Secondary | ICD-10-CM | POA: Diagnosis not present

## 2015-11-04 DIAGNOSIS — Z8742 Personal history of other diseases of the female genital tract: Secondary | ICD-10-CM | POA: Diagnosis not present

## 2015-11-04 DIAGNOSIS — Z79899 Other long term (current) drug therapy: Secondary | ICD-10-CM | POA: Insufficient documentation

## 2015-11-04 DIAGNOSIS — I10 Essential (primary) hypertension: Secondary | ICD-10-CM | POA: Insufficient documentation

## 2015-11-04 DIAGNOSIS — R079 Chest pain, unspecified: Secondary | ICD-10-CM | POA: Diagnosis not present

## 2015-11-04 DIAGNOSIS — R630 Anorexia: Secondary | ICD-10-CM | POA: Insufficient documentation

## 2015-11-04 DIAGNOSIS — R0602 Shortness of breath: Secondary | ICD-10-CM

## 2015-11-04 DIAGNOSIS — R05 Cough: Secondary | ICD-10-CM | POA: Diagnosis not present

## 2015-11-04 DIAGNOSIS — F419 Anxiety disorder, unspecified: Secondary | ICD-10-CM | POA: Insufficient documentation

## 2015-11-04 DIAGNOSIS — R490 Dysphonia: Secondary | ICD-10-CM | POA: Insufficient documentation

## 2015-11-04 DIAGNOSIS — E119 Type 2 diabetes mellitus without complications: Secondary | ICD-10-CM | POA: Diagnosis not present

## 2015-11-04 DIAGNOSIS — Z7984 Long term (current) use of oral hypoglycemic drugs: Secondary | ICD-10-CM | POA: Insufficient documentation

## 2015-11-04 LAB — CBC WITH DIFFERENTIAL/PLATELET
Basophils Absolute: 0.1 10*3/uL (ref 0.0–0.1)
Basophils Relative: 1 %
EOS PCT: 4 %
Eosinophils Absolute: 0.4 10*3/uL (ref 0.0–0.7)
HCT: 30.8 % — ABNORMAL LOW (ref 36.0–46.0)
Hemoglobin: 10.9 g/dL — ABNORMAL LOW (ref 12.0–15.0)
LYMPHS ABS: 1.9 10*3/uL (ref 0.7–4.0)
LYMPHS PCT: 18 %
MCH: 32.3 pg (ref 26.0–34.0)
MCHC: 35.4 g/dL (ref 30.0–36.0)
MCV: 91.4 fL (ref 78.0–100.0)
MONO ABS: 0.9 10*3/uL (ref 0.1–1.0)
Monocytes Relative: 8 %
Neutro Abs: 7.2 10*3/uL (ref 1.7–7.7)
Neutrophils Relative %: 69 %
PLATELETS: 214 10*3/uL (ref 150–400)
RBC: 3.37 MIL/uL — ABNORMAL LOW (ref 3.87–5.11)
RDW: 12.6 % (ref 11.5–15.5)
WBC: 10.5 10*3/uL (ref 4.0–10.5)

## 2015-11-04 LAB — URINALYSIS, ROUTINE W REFLEX MICROSCOPIC
BILIRUBIN URINE: NEGATIVE
Glucose, UA: NEGATIVE mg/dL
HGB URINE DIPSTICK: NEGATIVE
KETONES UR: NEGATIVE mg/dL
Leukocytes, UA: NEGATIVE
NITRITE: NEGATIVE
Protein, ur: NEGATIVE mg/dL
Specific Gravity, Urine: 1.007 (ref 1.005–1.030)
pH: 6.5 (ref 5.0–8.0)

## 2015-11-04 LAB — BASIC METABOLIC PANEL
Anion gap: 10 (ref 5–15)
BUN: 15 mg/dL (ref 6–20)
CALCIUM: 8.7 mg/dL — AB (ref 8.9–10.3)
CO2: 26 mmol/L (ref 22–32)
Chloride: 93 mmol/L — ABNORMAL LOW (ref 101–111)
Creatinine, Ser: 1.16 mg/dL — ABNORMAL HIGH (ref 0.44–1.00)
GFR calc Af Amer: 52 mL/min — ABNORMAL LOW (ref >60)
GFR, EST NON AFRICAN AMERICAN: 45 mL/min — AB (ref >60)
GLUCOSE: 162 mg/dL — AB (ref 65–99)
Potassium: 4 mmol/L (ref 3.5–5.1)
Sodium: 129 mmol/L — ABNORMAL LOW (ref 135–145)

## 2015-11-04 LAB — TROPONIN I

## 2015-11-04 MED ORDER — ALBUTEROL SULFATE HFA 108 (90 BASE) MCG/ACT IN AERS
2.0000 | INHALATION_SPRAY | RESPIRATORY_TRACT | Status: DC | PRN
Start: 2015-11-04 — End: 2015-11-04
  Administered 2015-11-04: 2 via RESPIRATORY_TRACT
  Filled 2015-11-04: qty 6.7
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
## 2015-11-04 NOTE — ED Notes (Signed)
Pt c/o SOB x 1 day , Seen by PMD today for chest xray and labs

## 2015-11-05 ENCOUNTER — Encounter (HOSPITAL_BASED_OUTPATIENT_CLINIC_OR_DEPARTMENT_OTHER): Payer: Self-pay | Admitting: Emergency Medicine

## 2015-11-05 ENCOUNTER — Emergency Department (HOSPITAL_BASED_OUTPATIENT_CLINIC_OR_DEPARTMENT_OTHER)
Admission: EM | Admit: 2015-11-05 | Discharge: 2015-11-05 | Disposition: A | Discharge status: Home / Self Care | Payer: Medicare Other | Attending: Emergency Medicine | Admitting: Emergency Medicine

## 2015-11-05 DIAGNOSIS — E079 Disorder of thyroid, unspecified: Secondary | ICD-10-CM | POA: Diagnosis not present

## 2015-11-05 DIAGNOSIS — Z79899 Other long term (current) drug therapy: Secondary | ICD-10-CM | POA: Diagnosis not present

## 2015-11-05 DIAGNOSIS — E119 Type 2 diabetes mellitus without complications: Secondary | ICD-10-CM | POA: Diagnosis not present

## 2015-11-05 DIAGNOSIS — E7800 Pure hypercholesterolemia, unspecified: Secondary | ICD-10-CM | POA: Diagnosis not present

## 2015-11-05 DIAGNOSIS — J069 Acute upper respiratory infection, unspecified: Secondary | ICD-10-CM | POA: Insufficient documentation

## 2015-11-05 DIAGNOSIS — R140 Abdominal distension (gaseous): Secondary | ICD-10-CM | POA: Diagnosis not present

## 2015-11-05 DIAGNOSIS — K219 Gastro-esophageal reflux disease without esophagitis: Secondary | ICD-10-CM | POA: Diagnosis not present

## 2015-11-05 DIAGNOSIS — R197 Diarrhea, unspecified: Secondary | ICD-10-CM | POA: Diagnosis not present

## 2015-11-05 DIAGNOSIS — R51 Headache: Secondary | ICD-10-CM | POA: Diagnosis not present

## 2015-11-05 DIAGNOSIS — F419 Anxiety disorder, unspecified: Secondary | ICD-10-CM | POA: Insufficient documentation

## 2015-11-05 DIAGNOSIS — Z8673 Personal history of transient ischemic attack (TIA), and cerebral infarction without residual deficits: Secondary | ICD-10-CM | POA: Diagnosis not present

## 2015-11-05 DIAGNOSIS — I10 Essential (primary) hypertension: Secondary | ICD-10-CM | POA: Insufficient documentation

## 2015-11-05 DIAGNOSIS — R05 Cough: Secondary | ICD-10-CM | POA: Diagnosis present

## 2015-11-05 MED ORDER — CETIRIZINE HCL 1 MG/ML PO SYRP: 5.0000 mg | ORAL_SOLUTION | Freq: Every day | ORAL | Status: DC

## 2015-11-05 MED ORDER — GUAIFENESIN 100 MG/5ML PO LIQD: 100.0000 mg | ORAL | Status: DC | PRN
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
## 2015-11-05 NOTE — ED Notes (Signed)
Pt c/o SOB x 2 days - the patient reports that she continues to have the SOB. The patient reports that she is now have multiple other complaints.

## 2015-11-05 NOTE — Discharge Instructions (Signed)
Upper Respiratory Infection, Adult Most upper respiratory infections (URIs) are a viral infection of the air passages leading to the lungs. A URI affects the nose, throat, and upper air passages. The most common type of URI is nasopharyngitis and is typically referred to as "the common cold." URIs run their course and usually go away on their own. Most of the time, a URI does not require medical attention, but sometimes a bacterial infection in the upper airways can follow a viral infection. This is called a secondary infection. Sinus and middle ear infections are common types of secondary upper respiratory infections. Bacterial pneumonia can also complicate a URI. A URI can worsen asthma and chronic obstructive pulmonary disease (COPD). Sometimes, these complications can require emergency medical care and may be life threatening.  CAUSES Almost all URIs are caused by viruses. A virus is a type of germ and can spread from one person to another.  RISKS FACTORS You may be at risk for a URI if:   You smoke.   You have chronic heart or lung disease.  You have a weakened defense (immune) system.   You are very young or very old.   You have nasal allergies or asthma.  You work in crowded or poorly ventilated areas.  You work in health care facilities or schools. SIGNS AND SYMPTOMS  Symptoms typically develop 2-3 days after you come in contact with a cold virus. Most viral URIs last 7-10 days. However, viral URIs from the influenza virus (flu virus) can last 14-18 days and are typically more severe. Symptoms may include:   Runny or stuffy (congested) nose.   Sneezing.   Cough.   Sore throat.   Headache.   Fatigue.   Fever.   Loss of appetite.   Pain in your forehead, behind your eyes, and over your cheekbones (sinus pain).  Muscle aches.  DIAGNOSIS  Your health care provider may diagnose a URI by:  Physical exam.  Tests to check that your symptoms are not due to  another condition such as:  Strep throat.  Sinusitis.  Pneumonia.  Asthma. TREATMENT  A URI goes away on its own with time. It cannot be cured with medicines, but medicines may be prescribed or recommended to relieve symptoms. Medicines may help:  Reduce your fever.  Reduce your cough.  Relieve nasal congestion. HOME CARE INSTRUCTIONS   Take medicines only as directed by your health care provider.   Gargle warm saltwater or take cough drops to comfort your throat as directed by your health care provider.  Use a warm mist humidifier or inhale steam from a shower to increase air moisture. This may make it easier to breathe.  Drink enough fluid to keep your urine clear or pale yellow.   Eat soups and other clear broths and maintain good nutrition.   Rest as needed.   Return to work when your temperature has returned to normal or as your health care provider advises. You may need to stay home longer to avoid infecting others. You can also use a face mask and careful hand washing to prevent spread of the virus.  Increase the usage of your inhaler if you have asthma.   Do not use any tobacco products, including cigarettes, chewing tobacco, or electronic cigarettes. If you need help quitting, ask your health care provider. PREVENTION  The best way to protect yourself from getting a cold is to practice good hygiene.   Avoid oral or hand contact with people with cold   symptoms.   Wash your hands often if contact occurs.  There is no clear evidence that vitamin C, vitamin E, echinacea, or exercise reduces the chance of developing a cold. However, it is always recommended to get plenty of rest, exercise, and practice good nutrition.  SEEK MEDICAL CARE IF:   You are getting worse rather than better.   Your symptoms are not controlled by medicine.   You have chills.  You have worsening shortness of breath.  You have brown or red mucus.  You have yellow or brown nasal  discharge.  You have pain in your face, especially when you bend forward.  You have a fever.  You have swollen neck glands.  You have pain while swallowing.  You have white areas in the back of your throat. SEEK IMMEDIATE MEDICAL CARE IF:   You have severe or persistent:  Headache.  Ear pain.  Sinus pain.  Chest pain.  You have chronic lung disease and any of the following:  Wheezing.  Prolonged cough.  Coughing up blood.  A change in your usual mucus.  You have a stiff neck.  You have changes in your:  Vision.  Hearing.  Thinking.  Mood. MAKE SURE YOU:   Understand these instructions.  Will watch your condition.  Will get help right away if you are not doing well or get worse.   This information is not intended to replace advice given to you by your health care provider. Make sure you discuss any questions you have with your health care provider.   Document Released: 12/27/2000 Document Revised: 11/17/2014 Document Reviewed: 10/08/2013 Elsevier Interactive Patient Education 2016 [employer]  

## 2015-11-11 DIAGNOSIS — E871 Hypo-osmolality and hyponatremia: Secondary | ICD-10-CM | POA: Insufficient documentation

## 2015-11-11 DIAGNOSIS — I10 Essential (primary) hypertension: Secondary | ICD-10-CM | POA: Insufficient documentation

## 2015-11-11 DIAGNOSIS — K449 Diaphragmatic hernia without obstruction or gangrene: Secondary | ICD-10-CM | POA: Insufficient documentation

## 2015-11-11 DIAGNOSIS — E119 Type 2 diabetes mellitus without complications: Secondary | ICD-10-CM | POA: Insufficient documentation

## 2015-11-19 ENCOUNTER — Emergency Department (HOSPITAL_COMMUNITY): Payer: Medicare Other

## 2015-11-19 ENCOUNTER — Encounter (HOSPITAL_COMMUNITY): Payer: Self-pay

## 2015-11-19 ENCOUNTER — Emergency Department (HOSPITAL_COMMUNITY)
Admission: EM | Admit: 2015-11-19 | Discharge: 2015-11-19 | Disposition: A | Discharge status: Home / Self Care | Payer: Medicare Other | Attending: Emergency Medicine | Admitting: Emergency Medicine

## 2015-11-19 DIAGNOSIS — Z79899 Other long term (current) drug therapy: Secondary | ICD-10-CM | POA: Diagnosis not present

## 2015-11-19 DIAGNOSIS — R109 Unspecified abdominal pain: Secondary | ICD-10-CM | POA: Diagnosis present

## 2015-11-19 DIAGNOSIS — E119 Type 2 diabetes mellitus without complications: Secondary | ICD-10-CM | POA: Insufficient documentation

## 2015-11-19 DIAGNOSIS — E7800 Pure hypercholesterolemia, unspecified: Secondary | ICD-10-CM | POA: Diagnosis not present

## 2015-11-19 DIAGNOSIS — R1032 Left lower quadrant pain: Secondary | ICD-10-CM

## 2015-11-19 DIAGNOSIS — E079 Disorder of thyroid, unspecified: Secondary | ICD-10-CM | POA: Diagnosis not present

## 2015-11-19 DIAGNOSIS — Z7984 Long term (current) use of oral hypoglycemic drugs: Secondary | ICD-10-CM | POA: Insufficient documentation

## 2015-11-19 DIAGNOSIS — I10 Essential (primary) hypertension: Secondary | ICD-10-CM | POA: Insufficient documentation

## 2015-11-19 DIAGNOSIS — K469 Unspecified abdominal hernia without obstruction or gangrene: Secondary | ICD-10-CM | POA: Diagnosis not present

## 2015-11-19 DIAGNOSIS — Z8673 Personal history of transient ischemic attack (TIA), and cerebral infarction without residual deficits: Secondary | ICD-10-CM | POA: Insufficient documentation

## 2015-11-19 DIAGNOSIS — F419 Anxiety disorder, unspecified: Secondary | ICD-10-CM | POA: Insufficient documentation

## 2015-11-19 DIAGNOSIS — R1900 Intra-abdominal and pelvic swelling, mass and lump, unspecified site: Secondary | ICD-10-CM

## 2015-11-19 DIAGNOSIS — G8929 Other chronic pain: Secondary | ICD-10-CM | POA: Insufficient documentation

## 2015-11-19 LAB — URINALYSIS, ROUTINE W REFLEX MICROSCOPIC
Bilirubin Urine: NEGATIVE
GLUCOSE, UA: NEGATIVE mg/dL
HGB URINE DIPSTICK: NEGATIVE
KETONES UR: NEGATIVE mg/dL
Leukocytes, UA: NEGATIVE
Nitrite: NEGATIVE
PH: 6 (ref 5.0–8.0)
PROTEIN: NEGATIVE mg/dL
Specific Gravity, Urine: 1.014 (ref 1.005–1.030)

## 2015-11-19 LAB — CBC
HEMATOCRIT: 32.5 % — AB (ref 36.0–46.0)
Hemoglobin: 11.1 g/dL — ABNORMAL LOW (ref 12.0–15.0)
MCH: 30.7 pg (ref 26.0–34.0)
MCHC: 34.2 g/dL (ref 30.0–36.0)
MCV: 89.8 fL (ref 78.0–100.0)
PLATELETS: 263 10*3/uL (ref 150–400)
RBC: 3.62 MIL/uL — ABNORMAL LOW (ref 3.87–5.11)
RDW: 13.4 % (ref 11.5–15.5)
WBC: 15 10*3/uL — ABNORMAL HIGH (ref 4.0–10.5)

## 2015-11-19 LAB — COMPREHENSIVE METABOLIC PANEL
ALBUMIN: 3.8 g/dL (ref 3.5–5.0)
ALT: 22 U/L (ref 14–54)
AST: 26 U/L (ref 15–41)
Alkaline Phosphatase: 50 U/L (ref 38–126)
Anion gap: 10 (ref 5–15)
BUN: 27 mg/dL — AB (ref 6–20)
CHLORIDE: 91 mmol/L — AB (ref 101–111)
CO2: 24 mmol/L (ref 22–32)
CREATININE: 1.79 mg/dL — AB (ref 0.44–1.00)
Calcium: 9.1 mg/dL (ref 8.9–10.3)
GFR calc Af Amer: 31 mL/min — ABNORMAL LOW (ref >60)
GFR calc non Af Amer: 27 mL/min — ABNORMAL LOW (ref >60)
Glucose, Bld: 188 mg/dL — ABNORMAL HIGH (ref 65–99)
Potassium: 4.6 mmol/L (ref 3.5–5.1)
Sodium: 125 mmol/L — ABNORMAL LOW (ref 135–145)
Total Bilirubin: 0.4 mg/dL (ref 0.3–1.2)
Total Protein: 6.9 g/dL (ref 6.5–8.1)

## 2015-11-19 LAB — LIPASE, BLOOD: LIPASE: 43 U/L (ref 11–51)

## 2015-11-19 LAB — I-STAT TROPONIN, ED: Troponin i, poc: 0 ng/mL (ref 0.00–0.08)

## 2015-11-19 LAB — I-STAT CG4 LACTIC ACID, ED: Lactic Acid, Venous: 1.69 mmol/L (ref 0.5–2.0)

## 2015-11-19 MED ORDER — SODIUM CHLORIDE 0.9 % IV BOLUS (SEPSIS): 1000.0000 mL | Freq: Once | INTRAVENOUS | Status: DC

## 2015-11-19 MED ORDER — OXYCODONE-ACETAMINOPHEN 5-325 MG PO TABS: 1.0000 | ORAL_TABLET | Freq: Four times a day (QID) | ORAL | Status: DC | PRN

## 2015-11-19 MED ORDER — ONDANSETRON 4 MG PO TBDP
ORAL_TABLET | ORAL | Status: DC
Start: 2015-11-19 — End: 2015-11-20
  Filled 2015-11-19: qty 1

## 2015-11-19 MED ORDER — OXYCODONE-ACETAMINOPHEN 5-325 MG PO TABS
ORAL_TABLET | ORAL | Status: AC
  Filled 2015-11-19: qty 1

## 2015-11-19 MED ORDER — BISMUTH SUBSALICYLATE 262 MG/15ML PO SUSP
30.0000 mL | Freq: Once | ORAL | Status: DC
  Filled 2015-11-19: qty 118

## 2015-11-19 MED ORDER — ONDANSETRON 4 MG PO TBDP
4.0000 mg | ORAL_TABLET | Freq: Once | ORAL | Status: AC | PRN
  Administered 2015-11-19: 4 mg via ORAL

## 2015-11-19 MED ORDER — ONDANSETRON HCL 4 MG PO TABS: 4.0000 mg | ORAL_TABLET | Freq: Four times a day (QID) | ORAL | Status: DC

## 2015-11-19 MED ORDER — DIATRIZOATE MEGLUMINE & SODIUM 66-10 % PO SOLN
ORAL | Status: AC
  Filled 2015-11-19: qty 30

## 2015-11-19 MED ORDER — OXYCODONE-ACETAMINOPHEN 5-325 MG PO TABS: 1.0000 | ORAL_TABLET | Freq: Once | ORAL | Status: DC

## 2015-11-19 MED ORDER — OXYCODONE-ACETAMINOPHEN 5-325 MG PO TABS: 1.0000 | ORAL_TABLET | ORAL | Status: DC | PRN
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
## 2015-11-19 NOTE — Discharge Instructions (Signed)
You need to follow-up with OBGYN.  Please call the doctor listed above.  Your kidney function was slightly decreased today.  You need to drink more water.  Please have your kidney function rechecked by your doctor in 1 week.  Abdominal Pain, Adult Many things can cause abdominal pain. Usually, abdominal pain is not caused by a disease and will improve without treatment. It can often be observed and treated at home. Your health care provider will do a physical exam and possibly order blood tests and X-rays to help determine the seriousness of your pain. However, in many cases, more time must pass before a clear cause of the pain can be found. Before that point, your health care provider may not know if you need more testing or further treatment. HOME CARE INSTRUCTIONS Monitor your abdominal pain for any changes. The following actions may help to alleviate any discomfort you are experiencing:  Only take over-the-counter or prescription medicines as directed by your health care provider.  Do not take laxatives unless directed to do so by your health care provider.  Try a clear liquid diet (broth, tea, or water) as directed by your health care provider. Slowly move to a bland diet as tolerated. SEEK MEDICAL CARE IF:  You have unexplained abdominal pain.  You have abdominal pain associated with nausea or diarrhea.  You have pain when you urinate or have a bowel movement.  You experience abdominal pain that wakes you in the night.  You have abdominal pain that is worsened or improved by eating food.  You have abdominal pain that is worsened with eating fatty foods.  You have a fever. SEEK IMMEDIATE MEDICAL CARE IF:  Your pain does not go away within 2 hours.  You keep throwing up (vomiting).  Your pain is felt only in portions of the abdomen, such as the right side or the left lower portion of the abdomen.  You pass bloody or black tarry stools. MAKE SURE YOU:  Understand these  instructions.  Will watch your condition.  Will get help right away if you are not doing well or get worse.   This information is not intended to replace advice given to you by your health care provider. Make sure you discuss any questions you have with your health care provider.   Document Released: 04/12/2005 Document Revised: 03/24/2015 Document Reviewed: 03/12/2013 Elsevier Interactive Patient Education 2016 Elsevier Inc. Ovarian Tumors The ovaries are small organs that produce eggs in women. They lie on each side of the uterus. Tumors are solid growths on the ovary, not like ovarian cysts that are filled with fluid. They can be cancerous or noncancerous. All solid tumors should be looked at to make sure they are not cancerous tumors.  RISK FACTORS There are no known causes for ovarian tumors. However, there are several risk factors for developing cancerous tumors on the ovary, such as:  Age.  [place] or [place] European descent.  Personal or family history of ovarian, colon, or breast cancer.  Presence of BRCA1 or BRCA2 genes.  Use of fertility medicines.  Late menopause (older than 50 years).  Pregnancy for the first time at age 38 years or older. SIGNS AND SYMPTOMS  In many cases there are no symptoms. Noncancerous tumors usually have no symptoms, but cancerous tumors may have symptoms that are minor and resemble other health problems. The following are symptoms of ovarian tumors:  Unexplained weight loss.  Increased abdominal size.  Belly (abdominal) pain.  Back or  pelvis pain or pressure.  Tiredness.  Abnormal vaginal bleeding.  Loss of appetite.  Frequent urination or pressure on your bladder.  Indigestion, increased gas, and bloating.  Painful sexual intercourse. DIAGNOSIS  The following test may be done to help diagnose ovarian tumors:  An ultrasound exam.  Imaging exams, such as an X-ray exam, CT scan, or MRI.  Blood tests. TREATMENT    The tumor will be studied in the lab under a microscope to see if it is cancer.  Noncancerous tumors can be removed surgically with or without removing the ovary.  Cancerous tumors usually are removed with the ovary and sometimes both ovaries are removed with the fallopian tubes, uterus, and surrounding lymph nodes to see if the cancer has spread.  Cancerous tumors may also be treated along with surgery and radiation, chemotherapy, or both. HOME CARE INSTRUCTIONS   Follow your health care provider's advice and recommendations regarding medicine and follow-up care.  Get a yearly physical and gynecology exam. This includes a pelvic exam if you are aged 68 years or older. SEEK MEDICAL CARE IF:   You have any of the above symptoms that have not gone away after a week of treatment.  You are losing weight for no reason.  You feel generally ill.   This information is not intended to replace advice given to you by your health care provider. Make sure you discuss any questions you have with your health care provider.   Document Released: 04/11/2008 Document Revised: 04/23/2013 Document Reviewed: 01/30/2013 Elsevier Interactive Patient Education [employer].

## 2015-11-19 NOTE — ED Provider Notes (Signed)
CSN: [id number]     Arrival date & time 11/19/15  1526 History   First MD Initiated Contact with Patient 11/19/15 1747     Chief Complaint  Patient presents with  . Abdominal Pain     (Consider location/radiation/quality/duration/timing/severity/associated sxs/prior Treatment) HPI Comments: Patient presents to the emergency department with chief complaint of abdominal pain. She states that she has had chronic abdominal pain, but that her symptoms acutely worsened today. She reports associated nausea and vomiting. States that her pain is moderate to severe. She states that she has a known hernia, as well as a mass on her ovary. She denies any associated fevers or chills. She has taken a Percocet and Zofran with some relief. Per the nursing note, EMS was initially called out for the patient being unresponsive, however when they arrived, the patient was alert and oriented, sitting up and talking. Patient states that she felt fatigued and like she is going to pass out, but denies a rash now.  The history is provided by the patient. No language interpreter was used.    Past Medical History  Diagnosis Date  . Anxiety   . Hypertension   . Elevated cholesterol   . TIA (transient ischemic attack)   . Thyroid disease   . Pancreatitis   . Vertigo   . Diabetes mellitus without complication [facility](H[facility])    Past Surgical History  Procedure Laterality Date  . Abdominal hysterectomy    . Cholecystectomy    . Tonsillectomy    . Appendectomy    . Vertigo     No family history on file. Social History  Substance Use Topics  . Smoking status: Never Smoker   . Smokeless tobacco: Never Used  . Alcohol Use: No   OB History    No data available     Review of Systems  Constitutional: Negative for fever and chills.  Respiratory: Negative for shortness of breath.   Cardiovascular: Negative for chest pain.  Gastrointestinal: Positive for nausea, vomiting and abdominal pain. Negative for diarrhea and  constipation.  Genitourinary: Negative for dysuria.  All other systems reviewed and are negative.     Allergies  Metformin and related  Home Medications   Prior to Admission medications   Medication Sig Start Date End Date Taking? Authorizing Provider  ACCU-CHEK AVIVA PLUS test strip  02/16/15   Historical Provider, MD  ACCU-CHEK SOFTCLIX LANCETS lancets  01/12/15   Historical Provider, MD  ALPRAZolam Prudy Feeler(XANAX) 1 MG tablet Take 1 mg by mouth at bedtime as needed for sleep.    Historical Provider, MD  atenolol (TENORMIN) 25 MG tablet  02/16/15   Historical Provider, MD  cetirizine (ZYRTEC) 1 MG/ML syrup Take 5 mLs (5 mg total) by mouth daily. 11/05/15   Rolland PorterMark James, MD  citalopram (CELEXA) 40 MG tablet Take 40 mg by mouth daily.    Historical Provider, MD  CORTISPORIN-TC 3.09-16-08-0.5 MG/ML otic suspension  02/12/15   Historical Provider, MD  glimepiride (AMARYL) 2 MG tablet  03/15/15   Historical Provider, MD  guaiFENesin (ROBITUSSIN) 100 MG/5ML liquid Take 5-10 mLs (100-200 mg total) by mouth every 4 (four) hours as needed for cough. 11/05/15   Rolland PorterMark James, MD  HYDROcodone-acetaminophen (NORCO/VICODIN) 5-325 MG per tablet Take 2 tablets by mouth every 4 (four) hours as needed. 02/14/15   Rolland PorterMark James, MD  hydrOXYzine (ATARAX/VISTARIL) 25 MG tablet Take 25 mg by mouth 3 (three) times daily as needed for itching.    Historical Provider, MD  levothyroxine (SYNTHROID,  LEVOTHROID) 100 MCG tablet Take 100 mcg by mouth daily before breakfast.    Historical Provider, MD  meclizine (ANTIVERT) 25 MG tablet Take 25 mg by mouth 3 (three) times daily.    Historical Provider, MD  memantine [facility]) 5 MG tablet  03/15/15   Historical Provider, MD  mupirocin ointment (BACTROBAN) 2 %  02/16/15   Historical Provider, MD  omeprazole (PRILOSEC) 40 MG capsule  03/15/15   Historical Provider, MD  oxyCODONE-acetaminophen (PERCOCET/ROXICET) 5-325 MG per tablet Take 1 tablet by mouth every 8 (eight) hours as needed for pain (do  not take with tylenol). 12/02/12   Zadie Rhine, MD  predniSONE (DELTASONE) 20 MG tablet  02/06/15   Historical Provider, MD  simvastatin (ZOCOR) 80 MG tablet Take 80 mg by mouth at bedtime.    Historical Provider, MD  triamcinolone cream (KENALOG) 0.1 %  02/16/15   Historical Provider, MD  triamterene-hydrochlorothiazide (MAXZIDE-25) 37.5-25 MG per tablet Take 1 tablet by mouth daily.    Historical Provider, MD   BP 121/57 mmHg  Pulse 65  Temp(Src) 97.9 F (36.6 C) (Oral)  Resp 18  SpO2 98% Physical Exam  Constitutional: She is oriented to person, place, and time. She appears well-developed and well-nourished.  HENT:  Head: Normocephalic and atraumatic.  Eyes: Conjunctivae and EOM are normal. Pupils are equal, round, and reactive to light.  Neck: Normal range of motion. Neck supple.  Cardiovascular: Normal rate and regular rhythm.  Exam reveals no gallop and no friction rub.   No murmur heard. Pulmonary/Chest: Effort normal and breath sounds normal. No respiratory distress. She has no wheezes. She has no rales. She exhibits no tenderness.  Abdominal: Soft. Bowel sounds are normal. She exhibits distension and mass. There is tenderness. There is rebound. There is no guarding.  Diffuse abdominal tenderness  Musculoskeletal: Normal range of motion. She exhibits no edema or tenderness.  Neurological: She is alert and oriented to person, place, and time.  Skin: Skin is warm and dry.  Psychiatric: She has a normal mood and affect. Her behavior is normal. Judgment and thought content normal.  Nursing note and vitals reviewed.   ED Course  Procedures (including critical care time) Labs Review Labs Reviewed  COMPREHENSIVE METABOLIC PANEL - Abnormal; Notable for the following:    Sodium 125 (*)    Chloride 91 (*)    Glucose, Bld 188 (*)    BUN 27 (*)    Creatinine, Ser 1.79 (*)    GFR calc non Af Amer 27 (*)    GFR calc Af Amer 31 (*)    All other components within normal limits  CBC  - Abnormal; Notable for the following:    WBC 15.0 (*)    RBC 3.62 (*)    Hemoglobin 11.1 (*)    HCT 32.5 (*)    All other components within normal limits  LIPASE, BLOOD  URINALYSIS, ROUTINE W REFLEX MICROSCOPIC (NOT AT [facility])  I-STAT TROPOININ, ED  I-STAT CG4 LACTIC ACID, ED    Imaging Review Ct Abdomen Pelvis Wo Contrast  11/19/2015  CLINICAL DATA:  Abdominal pain. EXAM: CT ABDOMEN AND PELVIS WITHOUT CONTRAST TECHNIQUE: Multidetector CT imaging of the abdomen and pelvis was performed following the standard protocol without IV contrast. COMPARISON:  11/11/2015 FINDINGS: There is a moderate hiatal hernia. Bowel is otherwise remarkable only for mild uncomplicated colonic diverticulosis. There are unremarkable unenhanced appearances of the liver, spleen, pancreas, adrenals and kidneys. Ureters and urinary bladder are unremarkable. The abdominal aorta is  normal in caliber with moderate atherosclerotic calcification. 11.6 cm left adnexal mass is again evident, unchanged from 11/11/2015 but it has enlarged since 02/14/2015 when it measured 9 cm. No acute inflammatory changes are evident in the abdomen or pelvis. There is no ascites. There is no significant abnormality in the lower chest. Unchanged linear scarring is present in both lung bases. There is no significant skeletal lesion. There is an unchanged ventral hernia at and above the level of the umbilicus, containing omentum. IMPRESSION: 1. No acute findings are evident in the abdomen or pelvis. 2. Left adnexal mass, suspicious for ovarian neoplasia. 3. Midline ventral hernia, containing omentum 4. Hiatal hernia Electronically Signed   By: Ellery Plunk M.D.   On: 11/19/2015 20:55   I have personally reviewed and evaluated these images and lab results as part of my medical decision-making.   EKG Interpretation   Date/Time:  Friday Nov 19 2015 15:37:14 EDT Ventricular Rate:  65 PR Interval:  138 QRS Duration: 90 QT Interval:  432 QTC  Calculation: 449 R Axis:   33 Text Interpretation:  Normal sinus rhythm Normal ECG Confirmed by Fayrene Fearing   MD, MARK ([id number]) on 11/19/2015 3:46:15 PM Also confirmed by Fayrene Fearing  MD, MARK  [phone]), editor [place] CT, Jola Babinski [id number])  on 11/19/2015 4:22:59 PM      MDM   Final diagnoses:  Pelvic mass in female  Left lower quadrant pain  Abdominal hernia without obstruction and without gangrene, recurrence not specified, unspecified hernia type    Patient with acute on chronic abdominal pain. 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 She states that she has follow-up with OB/GYN, but has been hoping that this would get better on its own.  Patient discussed with Dr. Fayrene Fearing, who states that he is familiar with this patient. Reviewed labs. Patient encouraged to increase fluids, likely mildly dehydrated. Creatinine is increased to 1.79. Patient has been hyponatremic in the past. Recommend oral fluids. Recommend recheck of creatinine in one week by primary care provider.  Patient given instructions to follow-up with primary care for this. She is stable and ready for discharge.   Roxy Horseman, PA-C 11/19/15 2300  Rolland Porter, MD 11/19/15 Joseph Pierini  Rolland Porter, MD 11/19/15 [phone]

## 2015-12-01 ENCOUNTER — Encounter: Payer: Self-pay | Admitting: Gynecologic Oncology

## 2015-12-01 ENCOUNTER — Ambulatory Visit: Payer: Medicare Other | Attending: Gynecologic Oncology | Admitting: Gynecologic Oncology

## 2015-12-01 VITALS — BP 132/66 | HR 66 | Temp 98.6°F | Resp 18 | Ht 62.0 in | Wt 171.4 lb

## 2015-12-01 DIAGNOSIS — K859 Acute pancreatitis without necrosis or infection, unspecified: Secondary | ICD-10-CM | POA: Insufficient documentation

## 2015-12-01 DIAGNOSIS — R1900 Intra-abdominal and pelvic swelling, mass and lump, unspecified site: Secondary | ICD-10-CM

## 2015-12-01 DIAGNOSIS — Z8673 Personal history of transient ischemic attack (TIA), and cerebral infarction without residual deficits: Secondary | ICD-10-CM | POA: Diagnosis not present

## 2015-12-01 DIAGNOSIS — F0390 Unspecified dementia without behavioral disturbance: Secondary | ICD-10-CM | POA: Insufficient documentation

## 2015-12-01 DIAGNOSIS — E079 Disorder of thyroid, unspecified: Secondary | ICD-10-CM | POA: Insufficient documentation

## 2015-12-01 DIAGNOSIS — K649 Unspecified hemorrhoids: Secondary | ICD-10-CM | POA: Diagnosis not present

## 2015-12-01 DIAGNOSIS — Z801 Family history of malignant neoplasm of trachea, bronchus and lung: Secondary | ICD-10-CM | POA: Insufficient documentation

## 2015-12-01 DIAGNOSIS — R109 Unspecified abdominal pain: Secondary | ICD-10-CM | POA: Insufficient documentation

## 2015-12-01 DIAGNOSIS — E7800 Pure hypercholesterolemia, unspecified: Secondary | ICD-10-CM | POA: Insufficient documentation

## 2015-12-01 DIAGNOSIS — Z90710 Acquired absence of both cervix and uterus: Secondary | ICD-10-CM | POA: Diagnosis not present

## 2015-12-01 DIAGNOSIS — K449 Diaphragmatic hernia without obstruction or gangrene: Secondary | ICD-10-CM | POA: Diagnosis not present

## 2015-12-01 DIAGNOSIS — K439 Ventral hernia without obstruction or gangrene: Secondary | ICD-10-CM | POA: Insufficient documentation

## 2015-12-01 DIAGNOSIS — Z79899 Other long term (current) drug therapy: Secondary | ICD-10-CM | POA: Diagnosis not present

## 2015-12-01 DIAGNOSIS — N839 Noninflammatory disorder of ovary, fallopian tube and broad ligament, unspecified: Secondary | ICD-10-CM

## 2015-12-01 DIAGNOSIS — Z9889 Other specified postprocedural states: Secondary | ICD-10-CM | POA: Insufficient documentation

## 2015-12-01 DIAGNOSIS — Z888 Allergy status to other drugs, medicaments and biological substances status: Secondary | ICD-10-CM | POA: Insufficient documentation

## 2015-12-01 DIAGNOSIS — K625 Hemorrhage of anus and rectum: Secondary | ICD-10-CM | POA: Diagnosis not present

## 2015-12-01 DIAGNOSIS — F419 Anxiety disorder, unspecified: Secondary | ICD-10-CM | POA: Insufficient documentation

## 2015-12-01 DIAGNOSIS — E119 Type 2 diabetes mellitus without complications: Secondary | ICD-10-CM | POA: Insufficient documentation

## 2015-12-01 DIAGNOSIS — I10 Essential (primary) hypertension: Secondary | ICD-10-CM | POA: Diagnosis not present

## 2015-12-01 DIAGNOSIS — R112 Nausea with vomiting, unspecified: Secondary | ICD-10-CM | POA: Diagnosis not present

## 2015-12-01 DIAGNOSIS — Z9049 Acquired absence of other specified parts of digestive tract: Secondary | ICD-10-CM | POA: Insufficient documentation

## 2015-12-01 DIAGNOSIS — G8929 Other chronic pain: Secondary | ICD-10-CM | POA: Diagnosis not present

## 2015-12-01 DIAGNOSIS — K432 Incisional hernia without obstruction or gangrene: Secondary | ICD-10-CM | POA: Insufficient documentation

## 2015-12-01 NOTE — Progress Notes (Signed)
Consult Note: Gyn-Onc  Consult was requested by Dr. Rolland Porter for the evaluation of Alicia Bean 75 y.o. female  CC:  Chief Complaint  Patient presents with  . Follow-up    Assessment/Plan:  Ms. Meghann Landing is a 75 y.o. with a left-sided adnexal mass that is slowly increasing over time. The CA-125 result I have is normal. The patient did have an episode of abdominal pain in May but neither she or her son believe that this is clearly related to her pelvic mass as she had "other things going on" at that time. At this time I discussed with them that if we were to proceed with surgery will be done via a vertical midline incision and that we would fix her incisional hernia at the same time. Alternatively this could be followed expectantly. The patient is convinced that this is not cancer does not require any additional treatment. Per the patient's son he will speak to his brothers and they will contact us within a week letting us know how they would like to proceed. No future appointments have been scheduled at this time  HPI: Ms. Alicia Bean is a 75 y.o.  G4P3 lmp 50 years ago.  S/p Hysterectomy for an ectopic pregnancy 50 years ago.  Bindi Klomp  was feeling fatigued and the subsequent anemia  evaluation included CT abd/pelvis 02/14/2015 IMPRESSION: 8 x 9 x 7.5 cm left ovarian mass/ cyst suspicious for neoplasm.Gynecologic consultation is recommended. No evidence of ascites orperitoneal/omental abnormality. CA 125 = 30.  She was seen by Dr. Nelly Rout in September 2016. At that time she refused examination or consideration of any type of surgery because "her faith informed her that this was not cancer would not be a problem". The patient returned to the emergency room 11/19/2015. The patient has a long history of chronic abdominal pain but she stated that on May 5 her symptoms acutely worsened. She reported associated nausea and vomiting and other pain was moderate to severe. She had imaging  performed that revealed:  COMPARISON: 11/11/2015  FINDINGS: There is a moderate hiatal hernia. Bowel is otherwise remarkable only for mild uncomplicated colonic diverticulosis.  There are unremarkable unenhanced appearances of the liver, spleen,pancreas, adrenals and kidneys. Ureters and urinary bladder areunremarkable.  The abdominal aorta is normal in caliber with moderate atherosclerotic calcification.  11.6 cm left adnexal mass is again evident, unchanged from 11/11/2015 but it has enlarged since 02/14/2015 when it measured 9 cm.  No acute inflammatory changes are evident in the abdomen or pelvis.There is no ascites.  There is no significant abnormality in the lower chest. Unchanged linear scarring is present in both lung bases.  There is no significant skeletal lesion.  There is an unchanged ventral hernia at and above the level of the umbilicus, containing omentum.  IMPRESSION: 1. No acute findings are evident in the abdomen or pelvis. 2. Left adnexal mass, suspicious for ovarian neoplasia. 3. Midline ventral hernia, containing omentum 4. Hiatal hernia  She states that when she was seen emergency room she had the fluid twice and wasn't feeling well. She been on steroids for short. Times that helped her clear mucous that she had. She states that the pain that she had in the emergency room was due to being sick. She's not sure if the pain was related to her incisional hernia versus a mass. She truly "believes that is not cancer". She states that due to her vertigo she needs to sit up quite a bit and this causes  abdominal swelling at night. She feels that her swelling is primarily edema at night and when she has swelling she has more pain. She denies any dysuria or pelvic pressure. She does occasionally have some rectal bleeding which she attributes to hemorrhoids. This only happens if she is constipated. She states that she has had a CA-125 to her primary physician Dr. Lerry Linerwight  Williams and his been normal. I do not have that value today. Her son did mention to me that he believes her dementia is getting worse. He is been staying at her home every night for the past several months. He lives a mile away from her. He is very supportive.  Review of Systems:  Constitutional:  No fever. No weight loss. Cardiovascular:No chest pain, shortness of breath, or edema  Pulmonary: No cough or wheeze.  Gastro Intestinal No nausea, vomitting, or diarrhea. + bright red blood per rectum as above, abdominal pain as above, no change in bowel movement, or constipation, no bloating or early satiety Genito Urinary: No frequency, urgency, dysuria,no vaginal bleeding Musculo Skeletal: No myalgia, arthralgia, joint swelling or pain  Neurologic: occ dizzy, + vertigo t,  Psychology: worsening dementia per son  Current Meds:  Outpatient Encounter Prescriptions as of 12/01/2015  Medication Sig  . ACCU-CHEK AVIVA PLUS test strip   . ACCU-CHEK SOFTCLIX LANCETS lancets   . ALPRAZolam (XANAX) 1 MG tablet Take 1 mg by mouth at bedtime as needed for sleep.  . citalopram (CELEXA) 40 MG tablet Take 40 mg by mouth daily.  . CORTISPORIN-TC 3.09-16-08-0.5 MG/ML otic suspension   . glimepiride (AMARYL) 2 MG tablet   . levothyroxine (SYNTHROID, LEVOTHROID) 100 MCG tablet Take 100 mcg by mouth daily before breakfast.  . meclizine (ANTIVERT) 25 MG tablet Take 25 mg by mouth 3 (three) times daily.  Marland Kitchen. omeprazole (PRILOSEC) 40 MG capsule   . ondansetron (ZOFRAN) 4 MG tablet Take 1 tablet (4 mg total) by mouth every 6 (six) hours.  Marland Kitchen. oxyCODONE-acetaminophen (PERCOCET/ROXICET) 5-325 MG tablet Take 1 tablet by mouth every 6 (six) hours as needed for severe pain.  . simvastatin (ZOCOR) 80 MG tablet Take 80 mg by mouth at bedtime.  Marland Kitchen. atenolol (TENORMIN) 25 MG tablet   . guaiFENesin (ROBITUSSIN) 100 MG/5ML liquid Take 5-10 mLs (100-200 mg total) by mouth every 4 (four) hours as needed for cough. (Patient not taking:  Reported on 12/01/2015)  . hydrOXYzine (ATARAX/VISTARIL) 25 MG tablet Take 25 mg by mouth 3 (three) times daily as needed for itching. Reported on 12/01/2015  . mupirocin ointment (BACTROBAN) 2 % Reported on 12/01/2015  . predniSONE (DELTASONE) 20 MG tablet Reported on 12/01/2015  . triamcinolone cream (KENALOG) 0.1 % Reported on 12/01/2015  . [DISCONTINUED] cetirizine (ZYRTEC) 1 MG/ML syrup Take 5 mLs (5 mg total) by mouth daily.  . [DISCONTINUED] memantine (NAMENDA) 5 MG tablet   . [DISCONTINUED] triamterene-hydrochlorothiazide (MAXZIDE-25) 37.5-25 MG per tablet Take 1 tablet by mouth daily.   No facility-administered encounter medications on file as of 12/01/2015.    Allergy:  Allergies  Allergen Reactions  . Metformin And Related     Social Hx:   Social History   Social History  . Marital Status: Widowed    Spouse Name: N/A  . Number of Children: N/A  . Years of Education: N/A   Occupational History  . Not on file.   Social History Main Topics  . Smoking status: Never Smoker   . Smokeless tobacco: Never Used  . Alcohol Use: No  .  Drug Use: No  . Sexual Activity: Not Currently   Other Topics Concern  . Not on file   Social History Narrative  Accompanied by her son.  Past Surgical Hx:  Past Surgical History  Procedure Laterality Date  . Abdominal hysterectomy    . Cholecystectomy    . Tonsillectomy    . Appendectomy    . Vertigo      Past Medical Hx:  Past Medical History  Diagnosis Date  . Anxiety   . Hypertension   . Elevated cholesterol   . TIA (transient ischemic attack)   . Thyroid disease   . Pancreatitis   . Vertigo   . Diabetes mellitus without complication [facility])     Past Gynecological History:  G4P3, menarche 9 with regular heavy menses.  Hysterectomy for ectopic pregnancy over 50 years ago  No h/o abn pap.  Family Hx: Sister with esophogeal cancer, sister with lung cancer  Physical Exam: WD in NAD  Neck: Supple, no lymphadenopathy, no  thyromegaly.  Lungs: Clear to auscultation bilaterally. Poor inspiratory effort.  Cardiovascular: Regular rate and rhythm  Abdomen: Well-healed vertical supraumbilical incision with 4 cm reducible incisional hernia. Vertical midline infraumbilical incision. Right sided right lower quadrant vertical midline incision. Abdomen is obese, soft, nontender, nondistended. There is no obvious fluid wave but exam is limited by habitus.  Extremities: 1+ edema.  Groins: No lymphadenopathy.  Pelvic: External genitalia within normal limits. Unit digital pelvic examination does reveal mobile pelvic mass in the left lower quadrant measuring approximately 10 cm. Marland Kitchen  Cleda Mccreedy A., MD 12/01/2015, 12:38 PM

## 2015-12-01 NOTE — Patient Instructions (Signed)
Please call our office once you have decided whether you would like to proceed with surgery or a repeat ultrasound in six months.

## 2016-07-25 ENCOUNTER — Telehealth: Payer: Self-pay | Admitting: *Deleted

## 2016-07-25 NOTE — Telephone Encounter (Signed)
Pt son called request for an appt with Dr. Duard BradyGehrig. Appt given for Feb 28 @ 2:45pm.

## 2016-08-22 ENCOUNTER — Encounter (HOSPITAL_BASED_OUTPATIENT_CLINIC_OR_DEPARTMENT_OTHER): Payer: Self-pay | Admitting: *Deleted

## 2016-08-22 ENCOUNTER — Emergency Department (HOSPITAL_BASED_OUTPATIENT_CLINIC_OR_DEPARTMENT_OTHER)
Admission: EM | Admit: 2016-08-22 | Discharge: 2016-08-22 | Disposition: A | Discharge status: Home / Self Care | Payer: Medicare Other | Attending: Emergency Medicine | Admitting: Emergency Medicine

## 2016-08-22 ENCOUNTER — Emergency Department (HOSPITAL_BASED_OUTPATIENT_CLINIC_OR_DEPARTMENT_OTHER): Payer: Medicare Other

## 2016-08-22 DIAGNOSIS — I10 Essential (primary) hypertension: Secondary | ICD-10-CM | POA: Insufficient documentation

## 2016-08-22 DIAGNOSIS — Z7984 Long term (current) use of oral hypoglycemic drugs: Secondary | ICD-10-CM | POA: Insufficient documentation

## 2016-08-22 DIAGNOSIS — R509 Fever, unspecified: Secondary | ICD-10-CM | POA: Diagnosis not present

## 2016-08-22 DIAGNOSIS — H9202 Otalgia, left ear: Secondary | ICD-10-CM | POA: Diagnosis not present

## 2016-08-22 DIAGNOSIS — Z79899 Other long term (current) drug therapy: Secondary | ICD-10-CM | POA: Diagnosis not present

## 2016-08-22 DIAGNOSIS — R0981 Nasal congestion: Secondary | ICD-10-CM | POA: Diagnosis not present

## 2016-08-22 DIAGNOSIS — R05 Cough: Secondary | ICD-10-CM | POA: Insufficient documentation

## 2016-08-22 DIAGNOSIS — E119 Type 2 diabetes mellitus without complications: Secondary | ICD-10-CM | POA: Insufficient documentation

## 2016-08-22 DIAGNOSIS — R69 Illness, unspecified: Secondary | ICD-10-CM

## 2016-08-22 DIAGNOSIS — J111 Influenza due to unidentified influenza virus with other respiratory manifestations: Secondary | ICD-10-CM

## 2016-08-22 DIAGNOSIS — R1110 Vomiting, unspecified: Secondary | ICD-10-CM | POA: Insufficient documentation

## 2016-08-22 LAB — URINALYSIS, ROUTINE W REFLEX MICROSCOPIC
Bilirubin Urine: NEGATIVE
Glucose, UA: NEGATIVE mg/dL
KETONES UR: NEGATIVE mg/dL
NITRITE: NEGATIVE
PROTEIN: NEGATIVE mg/dL
Specific Gravity, Urine: 1.005 (ref 1.005–1.030)
pH: 7.5 (ref 5.0–8.0)

## 2016-08-22 LAB — CBC WITH DIFFERENTIAL/PLATELET
BASOS ABS: 0 10*3/uL (ref 0.0–0.1)
Basophils Relative: 1 %
EOS PCT: 8 %
Eosinophils Absolute: 0.6 10*3/uL (ref 0.0–0.7)
HCT: 31 % — ABNORMAL LOW (ref 36.0–46.0)
Hemoglobin: 10.8 g/dL — ABNORMAL LOW (ref 12.0–15.0)
Lymphocytes Relative: 18 %
Lymphs Abs: 1.3 10*3/uL (ref 0.7–4.0)
MCH: 31.6 pg (ref 26.0–34.0)
MCHC: 34.8 g/dL (ref 30.0–36.0)
MCV: 90.6 fL (ref 78.0–100.0)
MONO ABS: 0.8 10*3/uL (ref 0.1–1.0)
Monocytes Relative: 11 %
Neutro Abs: 4.5 10*3/uL (ref 1.7–7.7)
Neutrophils Relative %: 62 %
PLATELETS: 205 10*3/uL (ref 150–400)
RBC: 3.42 MIL/uL — ABNORMAL LOW (ref 3.87–5.11)
RDW: 12.6 % (ref 11.5–15.5)
WBC: 7.2 10*3/uL (ref 4.0–10.5)

## 2016-08-22 LAB — BASIC METABOLIC PANEL
ANION GAP: 10 (ref 5–15)
BUN: 9 mg/dL (ref 6–20)
CO2: 26 mmol/L (ref 22–32)
CREATININE: 1.05 mg/dL — AB (ref 0.44–1.00)
Calcium: 8.5 mg/dL — ABNORMAL LOW (ref 8.9–10.3)
Chloride: 92 mmol/L — ABNORMAL LOW (ref 101–111)
GFR calc Af Amer: 58 mL/min — ABNORMAL LOW (ref >60)
GFR, EST NON AFRICAN AMERICAN: 50 mL/min — AB (ref >60)
GLUCOSE: 193 mg/dL — AB (ref 65–99)
Potassium: 4 mmol/L (ref 3.5–5.1)
Sodium: 128 mmol/L — ABNORMAL LOW (ref 135–145)

## 2016-08-22 LAB — URINALYSIS, MICROSCOPIC (REFLEX)

## 2016-08-22 LAB — I-STAT CG4 LACTIC ACID, ED: LACTIC ACID, VENOUS: 1.49 mmol/L (ref 0.5–1.9)

## 2016-08-22 MED ORDER — IBUPROFEN 400 MG PO TABS
600.0000 mg | ORAL_TABLET | Freq: Once | ORAL | Status: DC
  Filled 2016-08-22: qty 1

## 2016-08-22 MED ORDER — OSELTAMIVIR PHOSPHATE 75 MG PO CAPS: 75.0000 mg | ORAL_CAPSULE | Freq: Two times a day (BID) | ORAL | 0 refills | Status: DC

## 2016-08-22 MED ORDER — ACETAMINOPHEN 500 MG PO TABS
1000.0000 mg | ORAL_TABLET | Freq: Once | ORAL | Status: AC
  Administered 2016-08-22: 1000 mg via ORAL
  Filled 2016-08-22: qty 2

## 2016-08-22 MED ORDER — SODIUM CHLORIDE 0.9 % IV BOLUS (SEPSIS)
500.0000 mL | Freq: Once | INTRAVENOUS | Status: AC
  Administered 2016-08-22: 500 mL via INTRAVENOUS

## 2016-08-22 NOTE — ED Triage Notes (Addendum)
C/o vertigo x 3 weeks and n/v/d with slight fever. Cough NP. C/o abd pain on right where hernia is. Pt states she sat down on floor this am d/t vertigo.

## 2016-08-22 NOTE — Discharge Instructions (Signed)
Return to the ED with any concerns including difficulty breathing, vomiting and not able to keep down liquids, fainting, weakness of arms or legs, changes in vision or speech, decreased level of alertness/lethargy, or any other alarming symptoms

## 2016-08-22 NOTE — ED Notes (Signed)
Pt states she cannot take ibuprofen. Refused.

## 2016-08-22 NOTE — ED Provider Notes (Signed)
[facility] Provider Note   CSN: [id number] Arrival date & time: 08/22/16  0950     History   Chief Complaint Chief Complaint  Patient presents with  . Cough    HPI Alicia Bean is a 76 y.o. female.  HPI  Pt with hx of DM, HTN, dementia, chronic vertigo presenting with c/o cough, congestion, ear pain, low grade fever, vomiting and diarrhea.  Symptoms have been ongoing for the past week. Her son provides most of the history- he states he has been giving soup, pedialyte for hydration.  Today she was sitting on the floor and c/o left ear pain which prompted ED evaluation.  He states her vertigo is chronic and is worse when she gets sick.  No chest pain or difficulty breathing. There are no other associated systemic symptoms, there are no other alleviating or modifying factors.   Past Medical History:  Diagnosis Date  . Anxiety   . Diabetes mellitus without complication (HCC)   . Elevated cholesterol   . Hypertension   . Pancreatitis   . Thyroid disease   . TIA (transient ischemic attack)   . Vertigo     Patient Active Problem List   Diagnosis Date Noted  . Diabetes mellitus (HCC) 11/11/2015  . 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 BP (high blood pressure) 11/11/2015  . Below normal amount of sodium in the blood 11/11/2015  . Pelvic mass in female 03/25/2015  . Mass of pelvis 03/25/2015    Past Surgical History:  Procedure Laterality Date  . ABDOMINAL HYSTERECTOMY    . APPENDECTOMY    . CHOLECYSTECTOMY    . TONSILLECTOMY    . vertigo      OB History    No data available       Home Medications    Prior to Admission medications   Medication Sig Start Date End Date Taking? Authorizing Provider  ACCU-CHEK AVIVA PLUS test strip  02/16/15  Yes Historical Provider, MD  ACCU-CHEK SOFTCLIX LANCETS lancets  01/12/15  Yes Historical Provider, MD  ALPRAZolam Prudy Feeler) 1 MG tablet Take 1 mg by mouth at bedtime as needed for sleep.   Yes Historical Provider, MD    atenolol (TENORMIN) 25 MG tablet  02/16/15  Yes Historical Provider, MD  citalopram (CELEXA) 40 MG tablet Take 40 mg by mouth daily.   Yes Historical Provider, MD  CORTISPORIN-TC 3.09-16-08-0.5 MG/ML otic suspension  02/12/15  Yes Historical Provider, MD  fexofenadine (ALLEGRA ALLERGY) 60 MG tablet Take 60 mg by mouth 2 (two) times daily.   Yes Historical Provider, MD  glimepiride (AMARYL) 2 MG tablet  03/15/15  Yes Historical Provider, MD  levothyroxine (SYNTHROID, LEVOTHROID) 100 MCG tablet Take 100 mcg by mouth daily before breakfast.   Yes Historical Provider, MD  meclizine (ANTIVERT) 25 MG tablet Take 25 mg by mouth 3 (three) times daily.   Yes Historical Provider, MD  simvastatin (ZOCOR) 80 MG tablet Take 80 mg by mouth at bedtime.   Yes Historical Provider, MD  oseltamivir (TAMIFLU) 75 MG capsule Take 1 capsule (75 mg total) by mouth every 12 (twelve) hours. 08/22/16   Jerelyn Scott, MD    Family History No family history on file.  Social History Social History  Substance Use Topics  . Smoking status: Never Smoker  . Smokeless tobacco: Never Used  . Alcohol use No     Allergies   Fentanyl; Isosorbide; Metformin and related; Nitroglycerin; and Versed [midazolam]   Review of Systems Review of Systems  ROS reviewed and all otherwise negative except for mentioned in HPI   Physical Exam Updated Vital Signs BP 134/70 (BP Location: Right Arm)   Pulse 80   Temp 98.2 F (36.8 C)   Resp 22   Ht 5\' 2"  (1.575 m)   Wt 162 lb (73.5 kg)   SpO2 100%   BMI 29.63 kg/m  Vitals reviewed Physical Exam Physical Examination: General appearance - alert, well appearing, and in no distress Mental status - alert, oriented to person, place, and time Eyes -no conjunctival injection, no scleral icterus Mouth - mucous membranes moist, pharynx normal without lesions Neck - supple, no significant adenopathy Chest - clear to auscultation, no wheezes, rales or rhonchi, symmetric air entry, normal  respiratory effort Ears- bilateral clear fluid behind TMs, EACs clear Heart - normal rate, regular rhythm, normal S1, S2, no murmurs, rubs, clicks or gallops Abdomen - soft, nontender, nondistended, no masses or organomegaly Neurological - alert, orientedx 2, normal speech, moving all extremities, hx of dementia at baseline and mental status at baseline at time of discharge Extremities - peripheral pulses normal, no pedal edema, no clubbing or cyanosis Skin - normal coloration and turgor, no rashes  ED Treatments / Results  Labs (all labs ordered are listed, but only abnormal results are displayed) Labs Reviewed  CBC WITH DIFFERENTIAL/PLATELET - Abnormal; Notable for the following:       Result Value   RBC 3.42 (*)    Hemoglobin 10.8 (*)    HCT 31.0 (*)    All other components within normal limits  BASIC METABOLIC PANEL - Abnormal; Notable for the following:    Sodium 128 (*)    Chloride 92 (*)    Glucose, Bld 193 (*)    Creatinine, Ser 1.05 (*)    Calcium 8.5 (*)    GFR calc non Af Amer 50 (*)    GFR calc Af Amer 58 (*)    All other components within normal limits  URINALYSIS, ROUTINE W REFLEX MICROSCOPIC - Abnormal; Notable for the following:    Hgb urine dipstick TRACE (*)    Leukocytes, UA MODERATE (*)    All other components within normal limits  URINALYSIS, MICROSCOPIC (REFLEX) - Abnormal; Notable for the following:    Bacteria, UA MANY (*)    Squamous Epithelial / LPF 0-5 (*)    All other components within normal limits  CULTURE, BLOOD (ROUTINE X 2)  CULTURE, BLOOD (ROUTINE X 2)  URINE CULTURE  I-STAT CG4 LACTIC ACID, ED    EKG  EKG Interpretation None       Radiology Dg Chest 2 View  Result Date: 08/22/2016 CLINICAL DATA:  Cough and fever EXAM: CHEST  2 VIEW COMPARISON:  November 04, 2015 FINDINGS: There is no edema or consolidation. Heart is upper normal in size with pulmonary vascularity within normal limits. No adenopathy. There is atherosclerotic  calcification in the aorta. No bone lesions. IMPRESSION: Aortic atherosclerosis.  No edema or consolidation. Electronically Signed   By: Bretta BangWilliam  Woodruff III M.D.   On: 08/22/2016 11:55    Procedures Procedures (including critical care time)  Medications Ordered in ED Medications  ibuprofen (ADVIL,MOTRIN) tablet 600 mg (not administered)  sodium chloride 0.9 % bolus 500 mL (0 mLs Intravenous Stopped 08/22/16 1130)  acetaminophen (TYLENOL) tablet 1,000 mg (1,000 mg Oral Given 08/22/16 1149)     Initial Impression / Assessment and Plan / ED Course  I have reviewed the triage vital signs and the nursing notes.  Pertinent  labs & imaging results that were available during my care of the patient were reviewed by me and considered in my medical decision making (see chart for details).     Pt presenting with c/o congestion, cough, fever, bilateral ear pain.  Pt has had symptoms for approx one week.  Labs are reassuring, no elevation in WBC.  Chronic hyponatremia.  CXR shows no pneumonia.  UA appears contaminated but will send for urine culture.  She has primarily respiratory symptoms.  Suspect influenza.  Ear pain is due to clear fluid behind TMs, no infection. Advised antihistamine- she takes allegra, tylenol.  Rest, drinking plenty of fluids and close followup with PMD.  Long discussion about results and plan with patient and family at bedside.  Discharged with strict return precautions.  Pt agreeable with plan.  Final Clinical Impressions(s) / ED Diagnoses   Final diagnoses:  Influenza-like illness    New Prescriptions Discharge Medication List as of 08/22/2016  1:21 PM    START taking these medications   Details  oseltamivir (TAMIFLU) 75 MG capsule Take 1 capsule (75 mg total) by mouth every 12 (twelve) hours., Starting Tue 08/22/2016, Print         Jerelyn Scott, MD 08/23/16 [phone]

## 2016-08-23 LAB — URINE CULTURE

## 2016-08-27 LAB — CULTURE, BLOOD (ROUTINE X 2)
CULTURE: NO GROWTH
Culture: NO GROWTH

## 2016-09-12 ENCOUNTER — Telehealth: Payer: Self-pay

## 2016-09-12 NOTE — Telephone Encounter (Signed)
Son called to cancel her appointment with Dr. Duard BradyGehrig tomorrow 09-13-16.  She is feeling better.  He did not want to reschedule at this time. Appointment cancelled.

## 2016-09-13 ENCOUNTER — Ambulatory Visit: Payer: Medicare Other | Admitting: Gynecologic Oncology

## 2016-10-05 ENCOUNTER — Ambulatory Visit: Payer: Medicare Other | Admitting: Family

## 2016-10-12 ENCOUNTER — Ambulatory Visit: Payer: Medicare Other | Admitting: Nurse Practitioner

## 2017-08-08 IMAGING — CR DG CHEST 2V
2 series · 2 of 2 positions shown · non-contrast
Comparison: November 04, 2015

CLINICAL DATA: Cough and fever

EXAM:
CHEST  2 VIEW

[w chest pa]
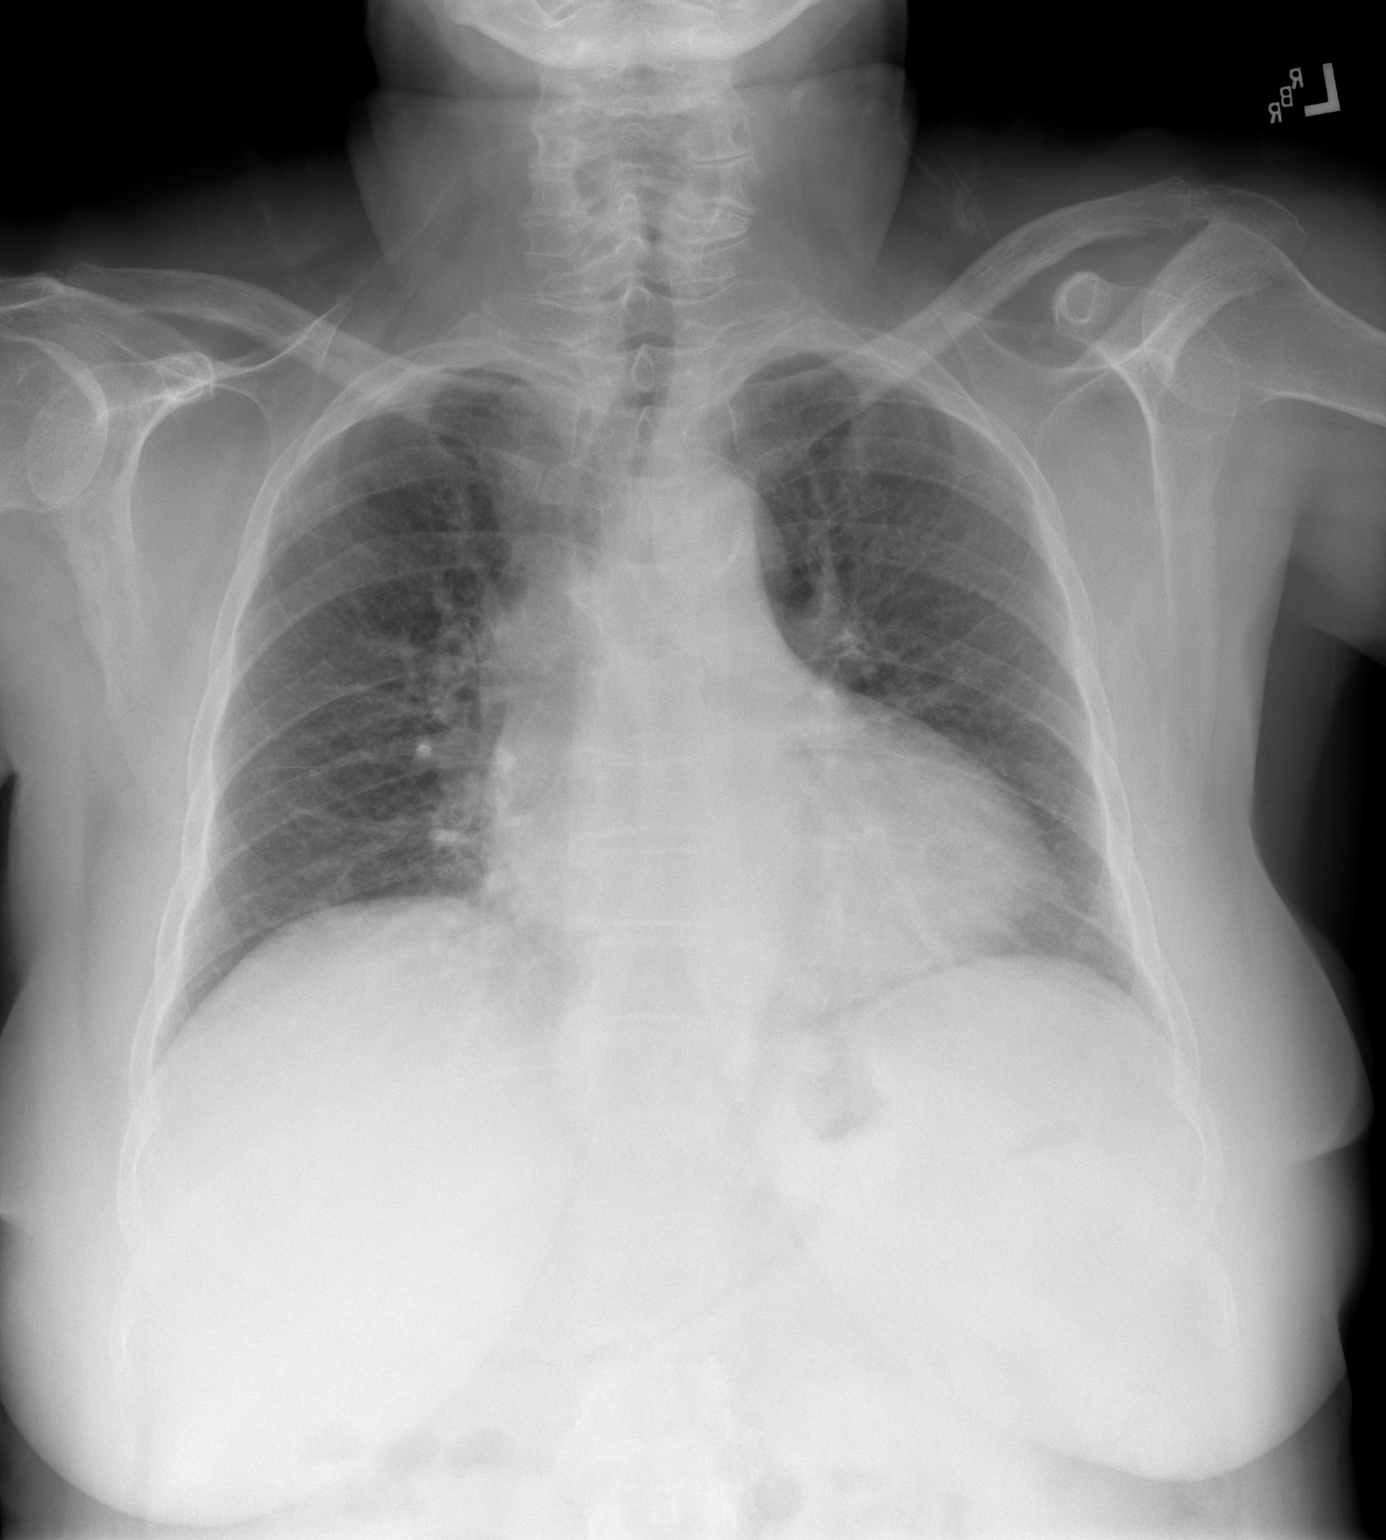

[w chest lat]
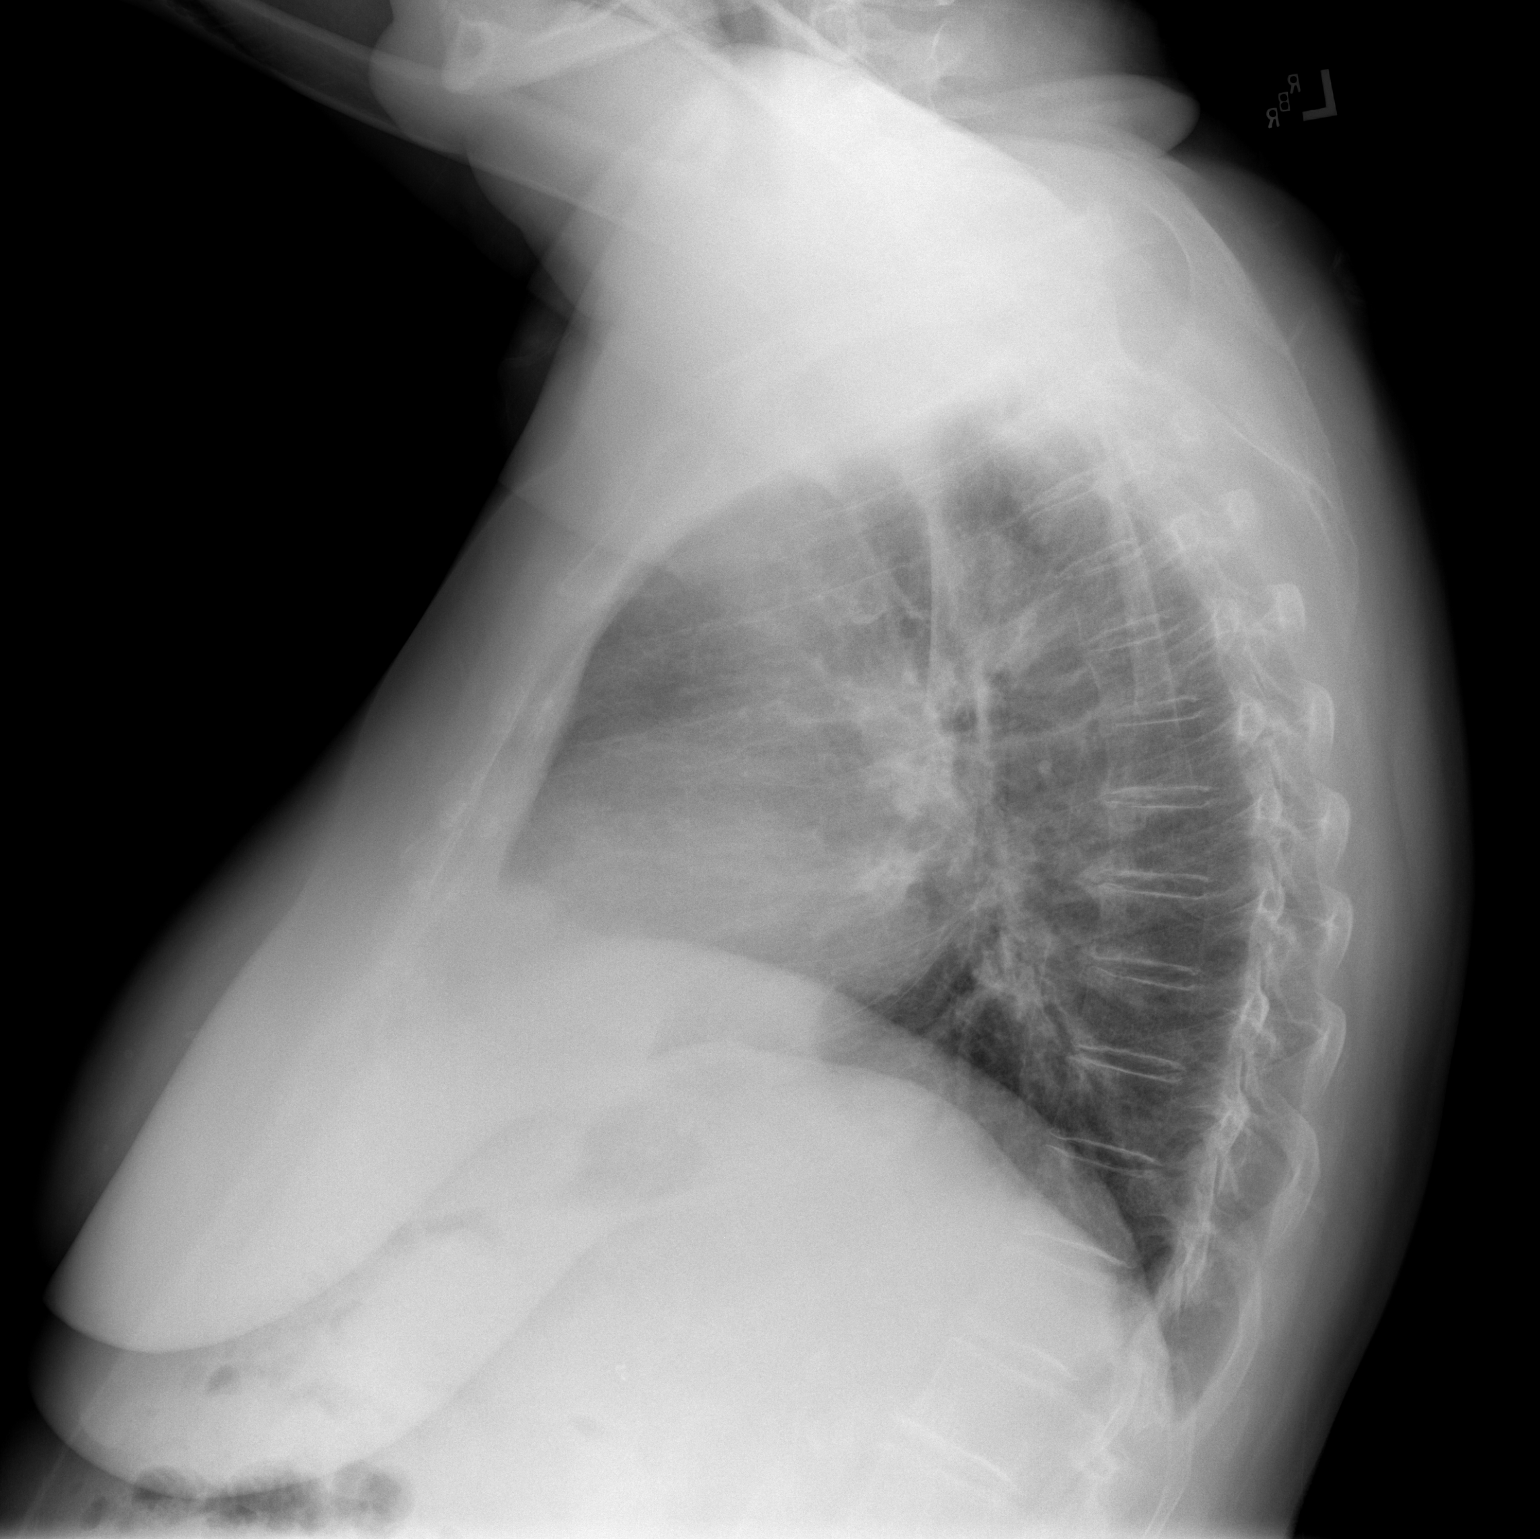

[2 of 2 positions shown; findings below may reference images not displayed]

FINDINGS: There is no edema or consolidation. Heart is upper normal in size
with pulmonary vascularity within normal limits. No adenopathy.
There is atherosclerotic calcification in the aorta. No bone
lesions.
IMPRESSION: Aortic atherosclerosis.  No edema or consolidation.

## 2017-09-11 IMAGING — CT CT ABD-PELV W/O CM
2 of 4 series · 16 of 46 positions shown, 18 images · non-contrast
Comparison: 11/11/2015

CLINICAL DATA: Abdominal pain.

EXAM:
CT ABDOMEN AND PELVIS WITHOUT CONTRAST
TECHNIQUE: Multidetector CT imaging of the abdomen and pelvis was performed
following the standard protocol without IV contrast.

[Series 2: a/p w/o 5mm · axial · non-contrast · 0.78mm/px · z∈[-429,-9]mm · 13 of 96 slices shown, 15 images]
[im 6/96  soft-tissue]
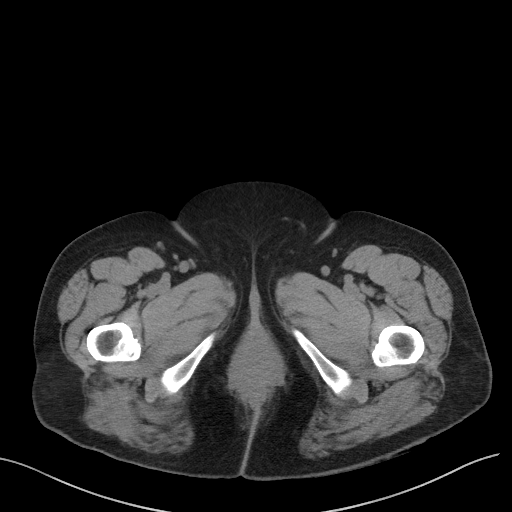
[im 6/96  bone]
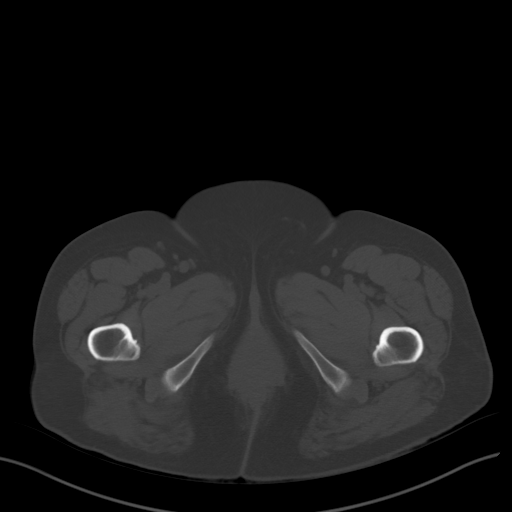
[im 11/96  soft-tissue]
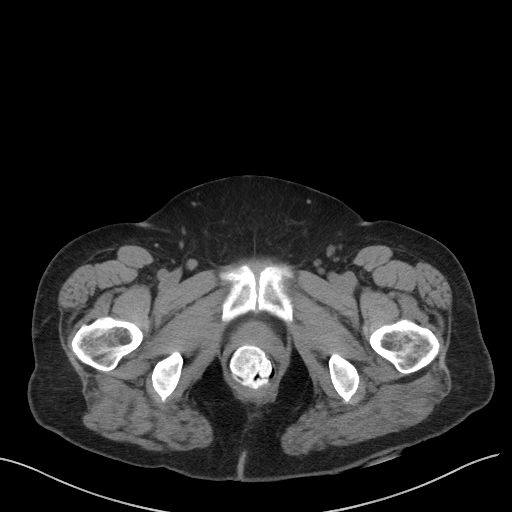
[im 22/96  soft-tissue]
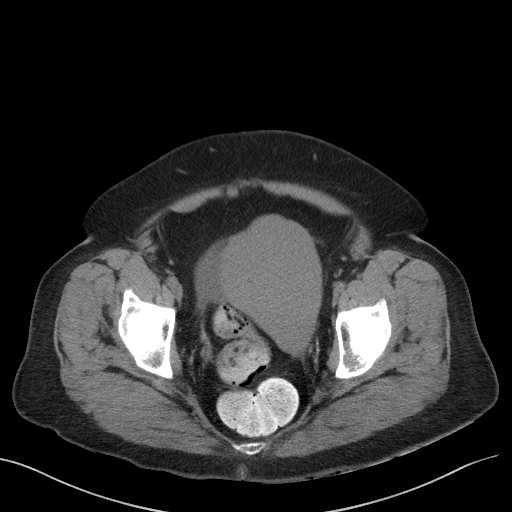
[im 27/96  soft-tissue]
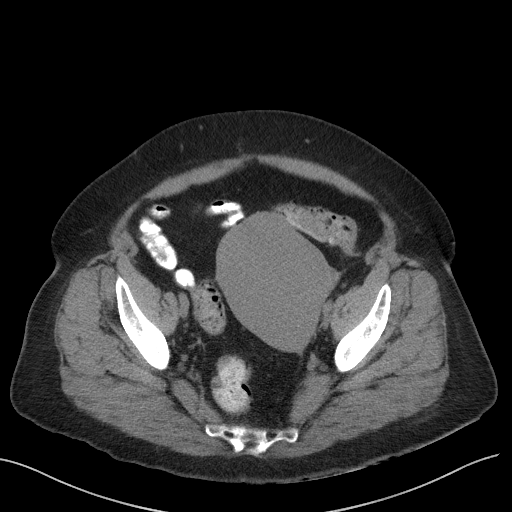
[im 32/96  soft-tissue]
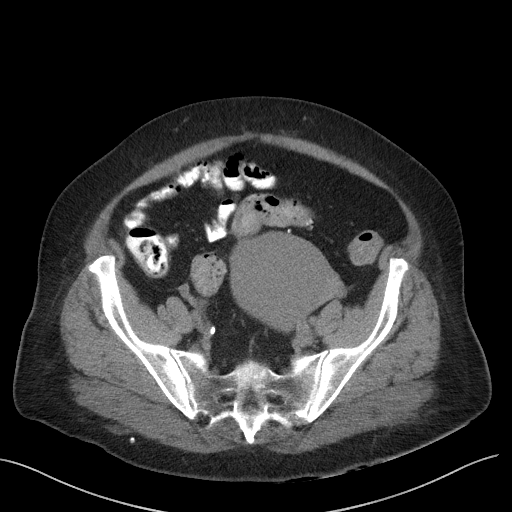
[im 43/96  soft-tissue]
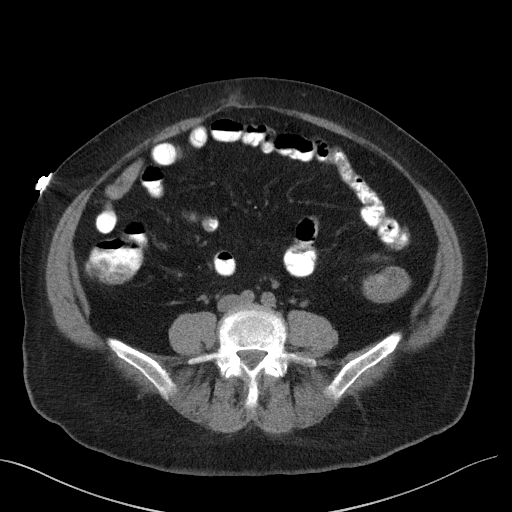
[im 48/96  soft-tissue]
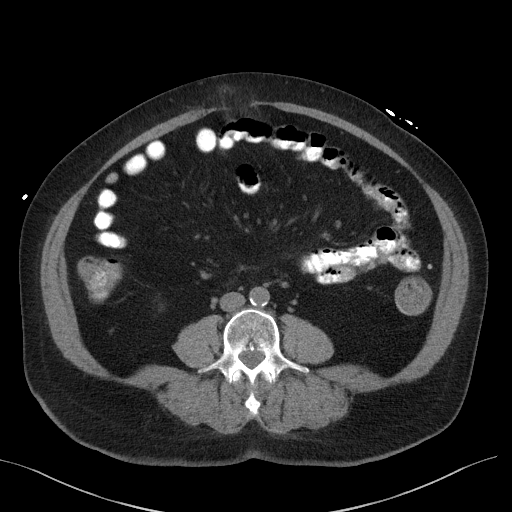
[im 53/96  soft-tissue]
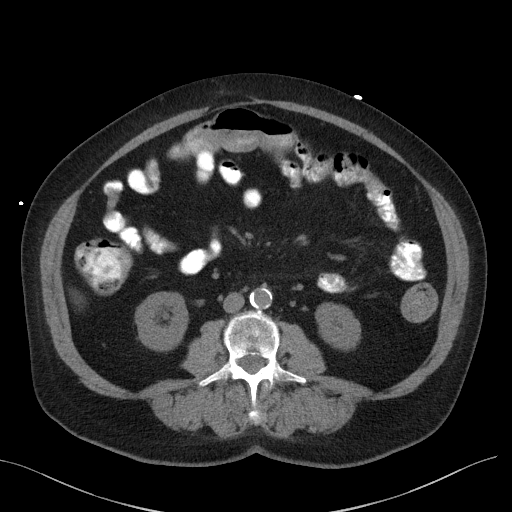
[im 64/96  soft-tissue]
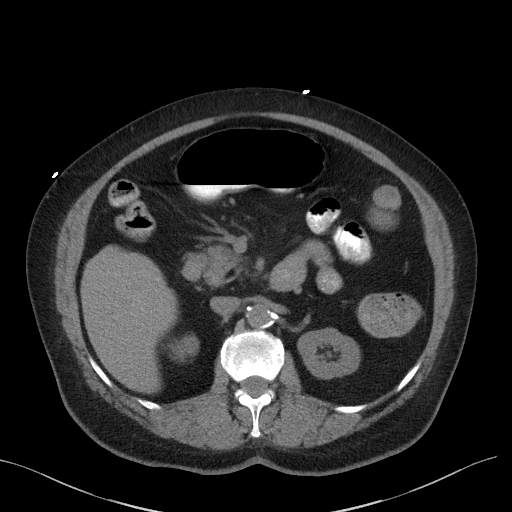
[im 64/96  bone]
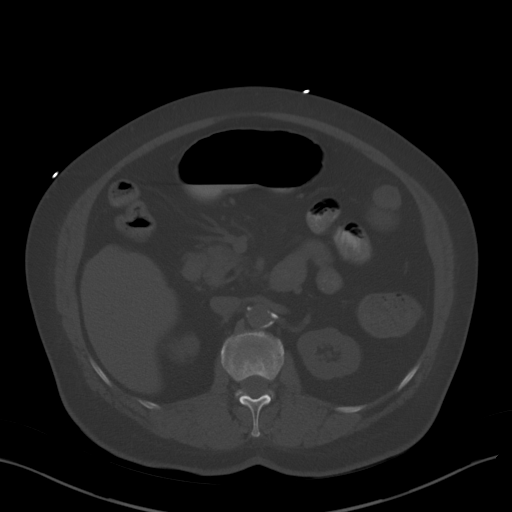
[im 69/96  soft-tissue]
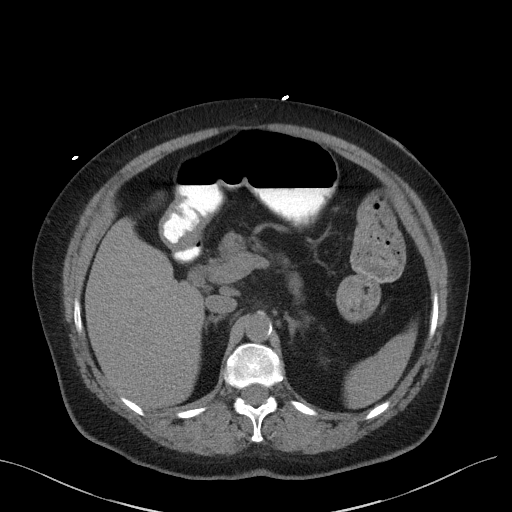
[im 74/96  soft-tissue]
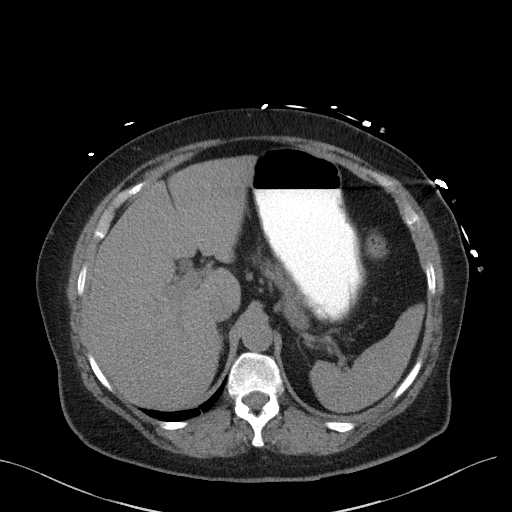
[im 85/96  soft-tissue]
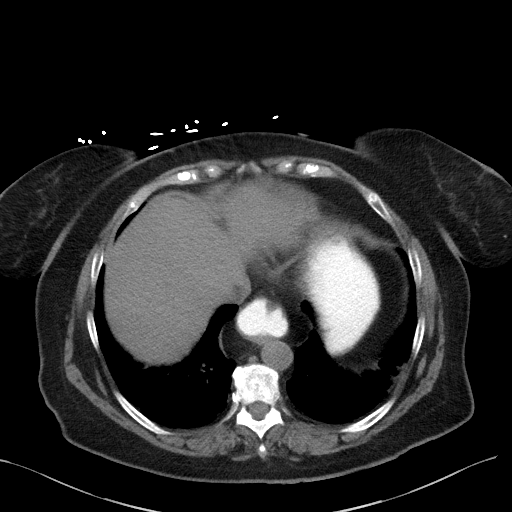
[im 90/96  soft-tissue]
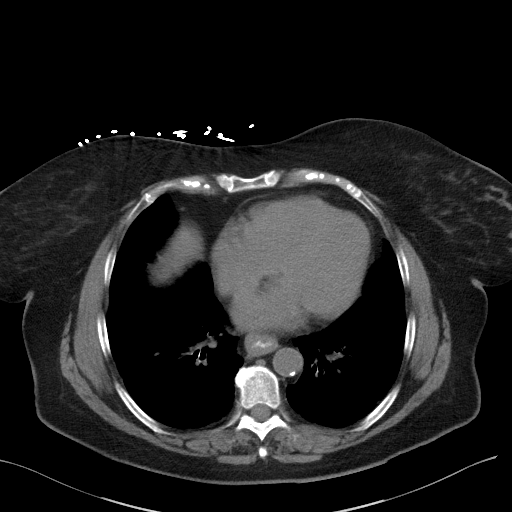

[Series 5: a/p w/o cor · coronal · non-contrast · 0.75mm/px · 3 of 151 slices shown]
[im 51/151  soft-tissue]
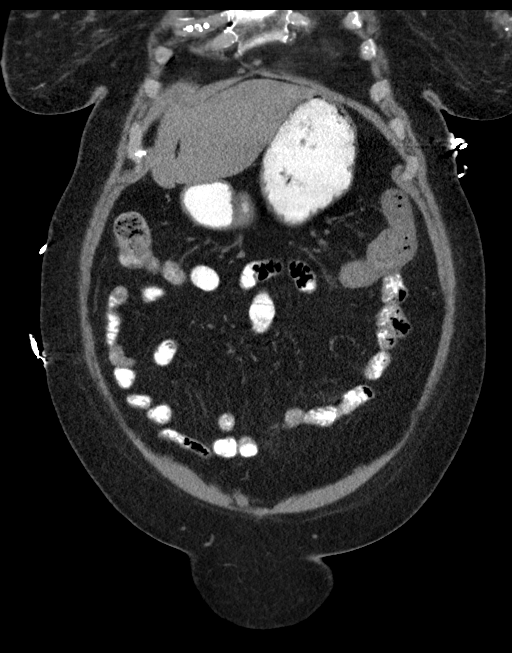
[im 67/151  soft-tissue]
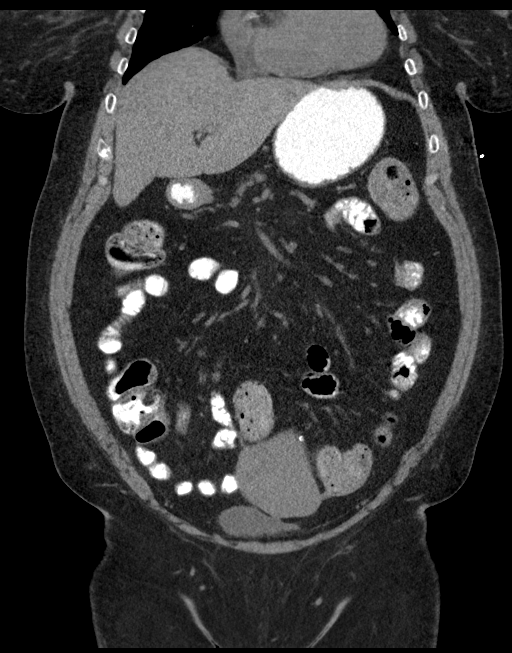
[im 84/151  soft-tissue]
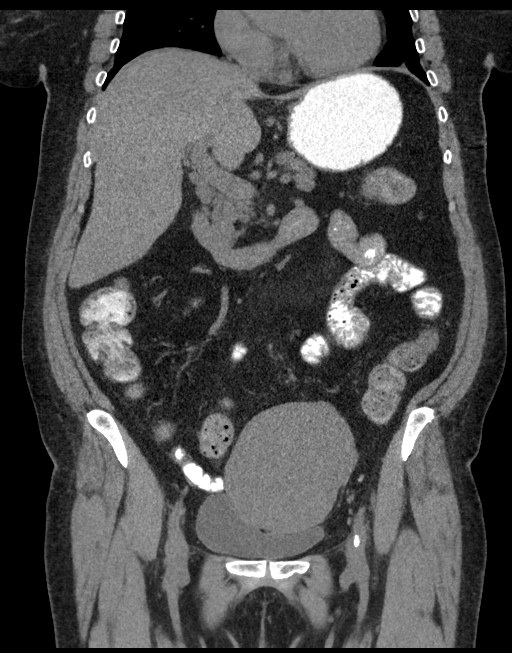

[16 of 46 positions shown; findings below may reference images not displayed]

FINDINGS: There is a moderate hiatal hernia. Bowel is otherwise remarkable
only for mild uncomplicated colonic diverticulosis.

There are unremarkable unenhanced appearances of the liver, spleen,
pancreas, adrenals and kidneys. Ureters and urinary bladder are
unremarkable.

The abdominal aorta is normal in caliber with moderate
atherosclerotic calcification.

11.6 cm left adnexal mass is again evident, unchanged from
11/11/2015 but it has enlarged since 02/14/2015 when it measured 9
cm.

No acute inflammatory changes are evident in the abdomen or pelvis.
There is no ascites.

There is no significant abnormality in the lower chest. Unchanged
linear scarring is present in both lung bases.

There is no significant skeletal lesion.

There is an unchanged ventral hernia at and above the level of the
umbilicus, containing omentum.
IMPRESSION: 1. No acute findings are evident in the abdomen or pelvis.
2. Left adnexal mass, suspicious for ovarian neoplasia.
3. Midline ventral hernia, containing omentum
4. Hiatal hernia

## 2022-11-25 ENCOUNTER — Encounter (HOSPITAL_BASED_OUTPATIENT_CLINIC_OR_DEPARTMENT_OTHER): Payer: Self-pay | Admitting: Emergency Medicine

## 2022-11-25 ENCOUNTER — Emergency Department (HOSPITAL_BASED_OUTPATIENT_CLINIC_OR_DEPARTMENT_OTHER): Payer: Medicare HMO

## 2022-11-25 ENCOUNTER — Observation Stay (HOSPITAL_BASED_OUTPATIENT_CLINIC_OR_DEPARTMENT_OTHER)
Admission: EM | Admit: 2022-11-25 | Discharge: 2022-11-27 | Disposition: A | Discharge status: Home / Self Care | Payer: Medicare HMO | Attending: Internal Medicine | Admitting: Internal Medicine

## 2022-11-25 DIAGNOSIS — E119 Type 2 diabetes mellitus without complications: Secondary | ICD-10-CM | POA: Diagnosis not present

## 2022-11-25 DIAGNOSIS — K5900 Constipation, unspecified: Principal | ICD-10-CM | POA: Insufficient documentation

## 2022-11-25 DIAGNOSIS — R1084 Generalized abdominal pain: Secondary | ICD-10-CM

## 2022-11-25 DIAGNOSIS — E039 Hypothyroidism, unspecified: Secondary | ICD-10-CM | POA: Insufficient documentation

## 2022-11-25 DIAGNOSIS — R10819 Abdominal tenderness, unspecified site: Secondary | ICD-10-CM | POA: Diagnosis present

## 2022-11-25 HISTORY — DX: Unspecified intestinal obstruction, unspecified as to partial versus complete obstruction: K56.609

## 2022-11-25 LAB — CBC WITH DIFFERENTIAL/PLATELET
Abs Immature Granulocytes: 0.02 10*3/uL (ref 0.00–0.07)
Basophils Absolute: 0 10*3/uL (ref 0.0–0.1)
Basophils Relative: 1 %
Eosinophils Absolute: 0.3 10*3/uL (ref 0.0–0.5)
Eosinophils Relative: 3 %
HCT: 29.5 % — ABNORMAL LOW (ref 36.0–46.0)
Hemoglobin: 10.3 g/dL — ABNORMAL LOW (ref 12.0–15.0)
Immature Granulocytes: 0 %
Lymphocytes Relative: 22 %
Lymphs Abs: 1.8 10*3/uL (ref 0.7–4.0)
MCH: 31.5 pg (ref 26.0–34.0)
MCHC: 34.9 g/dL (ref 30.0–36.0)
MCV: 90.2 fL (ref 80.0–100.0)
Monocytes Absolute: 0.6 10*3/uL (ref 0.1–1.0)
Monocytes Relative: 8 %
Neutro Abs: 5.6 10*3/uL (ref 1.7–7.7)
Neutrophils Relative %: 66 %
Platelets: 238 10*3/uL (ref 150–400)
RBC: 3.27 MIL/uL — ABNORMAL LOW (ref 3.87–5.11)
RDW: 13.2 % (ref 11.5–15.5)
WBC: 8.3 10*3/uL (ref 4.0–10.5)
nRBC: 0 % (ref 0.0–0.2)

## 2022-11-25 LAB — COMPREHENSIVE METABOLIC PANEL
ALT: 15 U/L (ref 0–44)
AST: 24 U/L (ref 15–41)
Albumin: 4.4 g/dL (ref 3.5–5.0)
Alkaline Phosphatase: 64 U/L (ref 38–126)
Anion gap: 11 (ref 5–15)
BUN: 13 mg/dL (ref 8–23)
CO2: 22 mmol/L (ref 22–32)
Calcium: 8.9 mg/dL (ref 8.9–10.3)
Chloride: 94 mmol/L — ABNORMAL LOW (ref 98–111)
Creatinine, Ser: 1.08 mg/dL — ABNORMAL HIGH (ref 0.44–1.00)
GFR, Estimated: 51 mL/min — ABNORMAL LOW (ref >60)
Glucose, Bld: 107 mg/dL — ABNORMAL HIGH (ref 70–99)
Potassium: 4 mmol/L (ref 3.5–5.1)
Sodium: 127 mmol/L — ABNORMAL LOW (ref 135–145)
Total Bilirubin: 0.5 mg/dL (ref 0.3–1.2)
Total Protein: 7.9 g/dL (ref 6.5–8.1)

## 2022-11-25 LAB — LACTIC ACID, PLASMA: Lactic Acid, Venous: 0.7 mmol/L (ref 0.5–1.9)

## 2022-11-25 LAB — MAGNESIUM: Magnesium: 1.6 mg/dL — ABNORMAL LOW (ref 1.7–2.4)

## 2022-11-25 LAB — LIPASE, BLOOD: Lipase: 40 U/L (ref 11–51)

## 2022-11-25 MED ORDER — MORPHINE SULFATE (PF) 2 MG/ML IV SOLN
2.0000 mg | Freq: Once | INTRAVENOUS | Status: AC
  Administered 2022-11-25: 2 mg via INTRAVENOUS
  Filled 2022-11-25: qty 1

## 2022-11-25 MED ORDER — SENNOSIDES-DOCUSATE SODIUM 8.6-50 MG PO TABS: 1.0000 | ORAL_TABLET | Freq: Every evening | ORAL | 0 refills | Status: AC | PRN

## 2022-11-25 MED ORDER — FLEET ENEMA 7-19 GM/118ML RE ENEM
1.0000 | ENEMA | Freq: Once | RECTAL | Status: AC
  Administered 2022-11-26: 1 via RECTAL
  Filled 2022-11-25: qty 1

## 2022-11-25 MED ORDER — POLYETHYLENE GLYCOL 3350 17 G PO PACK: 17.0000 g | PACK | Freq: Every day | ORAL | 0 refills | Status: AC

## 2022-11-25 MED ORDER — SODIUM CHLORIDE 0.9 % IV BOLUS
500.0000 mL | Freq: Once | INTRAVENOUS | Status: AC
  Administered 2022-11-25: 500 mL via INTRAVENOUS

## 2022-11-25 MED ORDER — IOHEXOL 300 MG/ML  SOLN
100.0000 mL | Freq: Once | INTRAMUSCULAR | Status: AC | PRN
  Administered 2022-11-25: 100 mL via INTRAVENOUS

## 2022-11-25 NOTE — ED Provider Notes (Signed)
[facility] Provider Note   CSN: [id number] Arrival date & time: 11/25/22  1751     History  Chief Complaint  Patient presents with   Constipation    Alicia Bean is a 82 y.o. female.  The history is provided by the patient, medical records and a relative. No language interpreter was used.  Constipation Severity:  Severe Time since last bowel movement:  4 days Timing:  Constant Progression:  Worsening Chronicity:  Recurrent Stool description:  None produced Relieved by:  Nothing Worsened by:  Nothing Ineffective treatments:  None tried Associated symptoms: abdominal pain   Associated symptoms: no back pain, no diarrhea, no dysuria, no fever, no flatus, no nausea, no urinary retention and no vomiting        Home Medications Prior to Admission medications   Medication Sig Start Date End Date Taking? Authorizing Provider  ACCU-CHEK AVIVA PLUS test strip  02/16/15   [provider]  ACCU-CHEK SOFTCLIX LANCETS lancets  01/12/15   [provider]  ALPRAZolam Prudy Feeler) 1 MG tablet Take 1 mg by mouth at bedtime as needed for sleep.    [provider]  atenolol (TENORMIN) 25 MG tablet  02/16/15   [provider]  citalopram (CELEXA) 40 MG tablet Take 40 mg by mouth daily.    [provider]  CORTISPORIN-TC 3.09-16-08-0.5 MG/ML otic suspension  02/12/15   [provider]  fexofenadine (ALLEGRA ALLERGY) 60 MG tablet Take 60 mg by mouth 2 (two) times daily.    [provider]  glimepiride (AMARYL) 2 MG tablet  03/15/15   [provider]  levothyroxine (SYNTHROID, LEVOTHROID) 100 MCG tablet Take 100 mcg by mouth daily before breakfast.    [provider]  meclizine (ANTIVERT) 25 MG tablet Take 25 mg by mouth 3 (three) times daily.    [provider]  oseltamivir (TAMIFLU) 75 MG capsule Take 1 capsule (75 mg total) by mouth every 12 (twelve) hours. 08/22/16    Phillis Haggis, MD  simvastatin (ZOCOR) 80 MG tablet Take 80 mg by mouth at bedtime.    [provider]      Allergies    Prednisone, Magnesium sulfate, Fentanyl, Glycerin, Isosorbide, Metformin and related, Nitroglycerin, Propoxyphene, and Versed [midazolam]    Review of Systems   Review of Systems  Constitutional:  Negative for chills, fatigue and fever.  HENT:  Negative for congestion.   Respiratory:  Negative for cough, chest tightness, shortness of breath and wheezing.   Cardiovascular:  Negative for chest pain.  Gastrointestinal:  Positive for abdominal distention, abdominal pain and constipation. Negative for diarrhea, flatus, nausea and vomiting.  Genitourinary:  Negative for dysuria and flank pain.  Musculoskeletal:  Negative for back pain, neck pain and neck stiffness.  Skin:  Negative for rash and wound.  Psychiatric/Behavioral:  Negative for agitation.   All other systems reviewed and are negative.   Physical Exam Updated Vital Signs BP (!) 154/92 (BP Location: Right Arm)   Pulse 99   Temp 98.3 F (36.8 C) (Oral)   Resp (!) 24   Ht 5\' 2"  (1.575 m)   Wt 72.6 kg   SpO2 100%   BMI 29.26 kg/m  Physical Exam Vitals and nursing note reviewed.  Constitutional:      General: She is not in acute distress.    Appearance: She is well-developed. She is not ill-appearing, toxic-appearing or diaphoretic.  HENT:     Head: Normocephalic and atraumatic.  Mouth/Throat:     Mouth: Mucous membranes are dry.     Pharynx: No oropharyngeal exudate or posterior oropharyngeal erythema.  Eyes:     Extraocular Movements: Extraocular movements intact.     Conjunctiva/sclera: Conjunctivae normal.     Pupils: Pupils are equal, round, and reactive to light.  Cardiovascular:     Rate and Rhythm: Normal rate and regular rhythm.     Heart sounds: No murmur heard. Pulmonary:     Effort: Pulmonary effort is normal. No respiratory distress.     Breath sounds: Normal breath  sounds.  Abdominal:     General: Abdomen is flat.     Palpations: Abdomen is soft.     Tenderness: There is abdominal tenderness. There is no right CVA tenderness, left CVA tenderness, guarding or rebound.  Genitourinary:    Comments: Rectum tender with some redness surrounding.  No evidence of significant cellulitis.  Hemorrhoids present but no thrombosis.  Could not palpate stool ball on initial DRE. Musculoskeletal:        General: No swelling or tenderness.     Cervical back: Neck supple.  Skin:    General: Skin is warm and dry.     Capillary Refill: Capillary refill takes less than 2 seconds.     Findings: No erythema.  Neurological:     Mental Status: She is alert.     Sensory: No sensory deficit.     Motor: No weakness.  Psychiatric:        Mood and Affect: Mood normal.     ED Results / Procedures / Treatments   Labs (all labs ordered are listed, but only abnormal results are displayed) Labs Reviewed - No data to display  EKG None  Radiology No results found.  Procedures Procedures    Medications Ordered in ED Medications - No data to display  ED Course/ Medical Decision Making/ A&P                             Medical Decision Making Amount and/or Complexity of Data Reviewed Labs: ordered. Radiology: ordered.  Risk Prescription drug management.    [name] is a 82 y.o. female with a past medical history significant for diabetes, anxiety, vertigo, previous bowel obstruction, and previous pelvic mass status postresection who presents with constipation, abdominal pain, and abdominal distention.  According to patient and family, she has not a bowel movement several days and abdomen is distended.  She reports is not passing gas.  She has 10 out of 10 pain across her abdomen.  She reports it feels similar to previous bowel obstruction.  She reports no nausea or vomiting and also denies fevers or chills.  She denies any urinary changes.  Family is concerned  as this is how she looked when she had previous obstruction.  They report she was admitted recently for hypoglycemia and altered mental status but that has improved.  She is denying any other injuries or complaints.  She denies any chest pain shortness of breath or palpitations.    On exam, lungs clear.  Chest nontender.  Abdomen is diffusely tender.  Bowel sounds were slightly appreciated.  Rectal exam was performed and there was no large palpable stool ball or impaction and patient did have some irritation of the skin around her rectum but no palpable abscess or cellulitis seen.  She had hemorrhoids but no thrombosed hemorrhoids.  Rest of exam unremarkable.  Patient does have dry  mucous membranes.  Clinically I am concerning to rule out bowel obstruction recurrence.  Will get CT abdomen pelvis and screening labs.  Will give her some fluids.  Will give some pain medicine.  Anticipate reassessment after workup to determine disposition.  Care transferred to oncoming team to await results of CT imaging and reassessment.            Final Clinical Impression(s) / ED Diagnoses Final diagnoses:  Generalized abdominal pain    Clinical Impression: 1. Generalized abdominal pain     Disposition: Care transferred to oncoming team to await results of CT imaging and reassessment.     This note was prepared with assistance of Conservation officer, historic buildings. Occasional wrong-word or sound-a-like substitutions may have occurred due to the inherent limitations of voice recognition software.      Jaki Steptoe, Canary Brim, MD 11/25/22 [phone]

## 2022-11-25 NOTE — ED Provider Notes (Signed)
Blood pressure (!) 170/86, pulse 100, temperature 97.9 F (36.6 C), temperature source Oral, resp. rate 18, height 5\' 2"  (1.575 m), weight 72.6 kg, SpO2 97 %.  Assuming care from Dr. Rush Landmark.  In short, Socorro Willison is a 82 y.o. female with a chief complaint of Constipation .  Refer to the original H&P for additional details.  The current plan of care is to follow up on CT.  11:43 PM  CT without SBO. Moderate stool burden. No impaction per exam by prior EDP. Will give enema here and reassess. Patient has been using stool softener only at home.   03:45 AM Patient has undergone multiple attempts to try and relieve her constipation here in the emergency department.  I repeated a rectal exam with the nurse chaperone and attempted disimpaction although this was not successful.  Patient had fleets enema as well as a soap suds enema without relief.  She is generally weak and does not have the ability to undertake a more aggressive bowel regimen at home.  Will discuss observation admission with the hospitalist for constipation management.   Discussed patient's case with [facility] to request admission. Patient and family (if present) updated with plan.   I reviewed all nursing notes, vitals, pertinent old records, EKGs, labs, imaging (as available).    Maia Plan, MD 11/26/22 [phone]

## 2022-11-25 NOTE — ED Triage Notes (Addendum)
Pt reports no BM x 1 wk; c/o general lower abd pain; hx of bowel obstruction

## 2022-11-25 NOTE — ED Notes (Signed)
Lab notified of add on Mag level.

## 2022-11-25 NOTE — Discharge Instructions (Signed)
You were seen in the emergency department today with constipation pain.  I have called in some stronger constipation medicines to help with your symptoms.  Please follow closely with your primary care doctor.  Return with any new or suddenly worsening symptoms.

## 2022-11-26 ENCOUNTER — Other Ambulatory Visit: Payer: Self-pay

## 2022-11-26 DIAGNOSIS — K5900 Constipation, unspecified: Secondary | ICD-10-CM | POA: Diagnosis present

## 2022-11-26 DIAGNOSIS — K5904 Chronic idiopathic constipation: Secondary | ICD-10-CM

## 2022-11-26 DIAGNOSIS — R10819 Abdominal tenderness, unspecified site: Secondary | ICD-10-CM | POA: Diagnosis present

## 2022-11-26 DIAGNOSIS — E119 Type 2 diabetes mellitus without complications: Secondary | ICD-10-CM | POA: Diagnosis not present

## 2022-11-26 DIAGNOSIS — E039 Hypothyroidism, unspecified: Secondary | ICD-10-CM | POA: Diagnosis not present

## 2022-11-26 LAB — GLUCOSE, CAPILLARY
Glucose-Capillary: 151 mg/dL — ABNORMAL HIGH (ref 70–99)
Glucose-Capillary: 165 mg/dL — ABNORMAL HIGH (ref 70–99)
Glucose-Capillary: 75 mg/dL (ref 70–99)
Glucose-Capillary: 130 mg/dL — ABNORMAL HIGH (ref 70–99)

## 2022-11-26 LAB — HEMOGLOBIN A1C
Hgb A1c MFr Bld: 7.3 % — ABNORMAL HIGH (ref 4.8–5.6)
Mean Plasma Glucose: 162.81 mg/dL

## 2022-11-26 LAB — CBG MONITORING, ED: Glucose-Capillary: 189 mg/dL — ABNORMAL HIGH (ref 70–99)

## 2022-11-26 MED ORDER — ACETAMINOPHEN 650 MG RE SUPP: 650.0000 mg | Freq: Four times a day (QID) | RECTAL | Status: DC | PRN

## 2022-11-26 MED ORDER — METOPROLOL TARTRATE 5 MG/5ML IV SOLN: 5.0000 mg | Freq: Four times a day (QID) | INTRAVENOUS | Status: DC | PRN

## 2022-11-26 MED ORDER — INSULIN ASPART 100 UNIT/ML IJ SOLN
0.0000 [IU] | Freq: Three times a day (TID) | INTRAMUSCULAR | Status: DC
  Administered 2022-11-26: 3 [IU] via SUBCUTANEOUS
  Administered 2022-11-26: 2 [IU] via SUBCUTANEOUS
  Administered 2022-11-27: 3 [IU] via SUBCUTANEOUS
  Administered 2022-11-27: 2 [IU] via SUBCUTANEOUS

## 2022-11-26 MED ORDER — ONDANSETRON HCL 4 MG/2ML IJ SOLN: 4.0000 mg | Freq: Four times a day (QID) | INTRAMUSCULAR | Status: DC | PRN

## 2022-11-26 MED ORDER — LACTATED RINGERS IV SOLN: INTRAVENOUS | Status: DC

## 2022-11-26 MED ORDER — ONDANSETRON HCL 4 MG PO TABS: 4.0000 mg | ORAL_TABLET | Freq: Four times a day (QID) | ORAL | Status: DC | PRN

## 2022-11-26 MED ORDER — BISACODYL 5 MG PO TBEC
5.0000 mg | DELAYED_RELEASE_TABLET | Freq: Every day | ORAL | Status: DC
  Filled 2022-11-26: qty 1

## 2022-11-26 MED ORDER — ENOXAPARIN SODIUM 40 MG/0.4ML IJ SOSY
40.0000 mg | PREFILLED_SYRINGE | INTRAMUSCULAR | Status: DC
  Administered 2022-11-26 – 2022-11-27 (×2): 40 mg via SUBCUTANEOUS
  Filled 2022-11-26 (×2): qty 0.4

## 2022-11-26 MED ORDER — INSULIN ASPART 100 UNIT/ML IJ SOLN: 0.0000 [IU] | Freq: Every day | INTRAMUSCULAR | Status: DC

## 2022-11-26 MED ORDER — ALPRAZOLAM 0.5 MG PO TABS
1.0000 mg | ORAL_TABLET | Freq: Once | ORAL | Status: AC
  Administered 2022-11-26: 1 mg via ORAL
  Filled 2022-11-26: qty 2

## 2022-11-26 MED ORDER — ACETAMINOPHEN 325 MG PO TABS
650.0000 mg | ORAL_TABLET | Freq: Four times a day (QID) | ORAL | Status: DC | PRN
  Administered 2022-11-26 (×2): 650 mg via ORAL
  Filled 2022-11-26 (×2): qty 2

## 2022-11-26 MED ORDER — POTASSIUM CHLORIDE IN NACL 20-0.9 MEQ/L-% IV SOLN
INTRAVENOUS | Status: DC
  Filled 2022-11-26 (×3): qty 1000

## 2022-11-26 MED ORDER — POLYETHYLENE GLYCOL 3350 17 G PO PACK
17.0000 g | PACK | Freq: Two times a day (BID) | ORAL | Status: DC
  Administered 2022-11-26 (×2): 17 g via ORAL
  Filled 2022-11-26 (×2): qty 1

## 2022-11-26 MED ORDER — MAGNESIUM CITRATE PO SOLN
1.0000 | Freq: Once | ORAL | Status: AC
  Administered 2022-11-26: 1 via ORAL
  Filled 2022-11-26: qty 296

## 2022-11-26 MED ORDER — ALBUTEROL SULFATE (2.5 MG/3ML) 0.083% IN NEBU: 2.5000 mg | INHALATION_SOLUTION | RESPIRATORY_TRACT | Status: DC | PRN

## 2022-11-26 NOTE — ED Notes (Signed)
ED TO INPATIENT HANDOFF REPORT  ED Nurse Name and Phone #: Darryll Capers, RN [phone]  S Name/Age/Gender Alicia Bean 82 y.o. female Room/Bed: MH08/MH08  Code Status   Code Status: Not on file  Home/SNF/Other Home Patient oriented to: self, place, time, and situation Is this baseline? Yes   Triage Complete: Triage complete  Chief Complaint Constipation [K59.00]  Triage Note Pt reports no BM x 1 wk; c/o general lower abd pain; hx of bowel obstruction   Allergies Allergies  Allergen Reactions   Prednisone Shortness Of Breath   Magnesium Sulfate Other (See Comments)    Low B/P   Fentanyl    Glycerin Other (See Comments)    Unknown   Isosorbide    Metformin And Related    Nitroglycerin    Propoxyphene Other (See Comments)    Unknown.   Versed [Midazolam]     Level of Care/Admitting Diagnosis ED Disposition     ED Disposition  Admit   Condition  --   Comment  Hospital Area: [facility] [[id number]]  Level of Care: Med-Surg [16]  Interfacility transfer: Yes  May place patient in observation at [facility] or [facility] if equivalent level of care is available:: Yes  Covid Evaluation: Asymptomatic - no recent exposure (last 10 days) testing not required  Diagnosis: Constipation [478295]  Admitting Physician: Arlean Hopping [[id number]]  Attending Physician: Arlean Hopping [[id number]]          B Medical/Surgery History Past Medical History:  Diagnosis Date   Anxiety    Bowel obstruction (HCC)    Diabetes mellitus without complication (HCC)    Elevated cholesterol    Hypertension    Pancreatitis    Thyroid disease    TIA (transient ischemic attack)    Vertigo    Past Surgical History:  Procedure Laterality Date   ABDOMINAL HYSTERECTOMY     APPENDECTOMY     CHOLECYSTECTOMY     TONSILLECTOMY     vertigo       A IV Location/Drains/Wounds Patient Lines/Drains/Airways Status     Active Line/Drains/Airways     Name  Placement date Placement time Site Days   Peripheral IV 11/25/22 20 G Right Antecubital 11/25/22  2202  Antecubital  1            Intake/Output Last 24 hours No intake or output data in the 24 hours ending 11/26/22 0419  Labs/Imaging Results for orders placed or performed during the hospital encounter of 11/25/22 (from the past 48 hour(s))  Comprehensive metabolic panel     Status: Abnormal   Collection Time: 11/25/22  9:58 PM  Result Value Ref Range   Sodium 127 (L) 135 - 145 mmol/L   Potassium 4.0 3.5 - 5.1 mmol/L   Chloride 94 (L) 98 - 111 mmol/L   CO2 22 22 - 32 mmol/L   Glucose, Bld 107 (H) 70 - 99 mg/dL    Comment: Glucose reference range applies only to samples taken after fasting for at least 8 hours.   BUN 13 8 - 23 mg/dL   Creatinine, Ser 2.95 (H) 0.44 - 1.00 mg/dL   Calcium 8.9 8.9 - 28.4 mg/dL   Total Protein 7.9 6.5 - 8.1 g/dL   Albumin 4.4 3.5 - 5.0 g/dL   AST 24 15 - 41 U/L   ALT 15 0 - 44 U/L   Alkaline Phosphatase 64 38 - 126 U/L   Total Bilirubin 0.5 0.3 - 1.2 mg/dL   GFR, Estimated  51 (L) >60 mL/min    Comment: (NOTE) Calculated using the CKD-EPI Creatinine Equation (2021)    Anion gap 11 5 - 15    Comment: Performed at [facility], [address] [place], [place] [address]  CBC with Differential     Status: Abnormal   Collection Time: 11/25/22  9:58 PM  Result Value Ref Range   WBC 8.3 4.0 - 10.5 K/uL   RBC 3.27 (L) 3.87 - 5.11 MIL/uL   Hemoglobin 10.3 (L) 12.0 - 15.0 g/dL   HCT 19.1 (L) 47.8 - 29.5 %   MCV 90.2 80.0 - 100.0 fL   MCH 31.5 26.0 - 34.0 pg   MCHC 34.9 30.0 - 36.0 g/dL   RDW 62.1 30.8 - 65.7 %   Platelets 238 150 - 400 K/uL   nRBC 0.0 0.0 - 0.2 %   Neutrophils Relative % 66 %   Neutro Abs 5.6 1.7 - 7.7 K/uL   Lymphocytes Relative 22 %   Lymphs Abs 1.8 0.7 - 4.0 K/uL   Monocytes Relative 8 %   Monocytes Absolute 0.6 0.1 - 1.0 K/uL   Eosinophils Relative 3 %   Eosinophils Absolute 0.3 0.0 - 0.5 K/uL   Basophils  Relative 1 %   Basophils Absolute 0.0 0.0 - 0.1 K/uL   Immature Granulocytes 0 %   Abs Immature Granulocytes 0.02 0.00 - 0.07 K/uL    Comment: Performed at [facility], [address] [place], [place] [address]  Lipase, blood     Status: None   Collection Time: 11/25/22  9:58 PM  Result Value Ref Range   Lipase 40 11 - 51 U/L    Comment: Performed at [facility], [address] [place], [place] [address]  Magnesium     Status: Abnormal   Collection Time: 11/25/22  9:58 PM  Result Value Ref Range   Magnesium 1.6 (L) 1.7 - 2.4 mg/dL    Comment: Performed at [facility], [address] [place], [place] [address]  Lactic acid, plasma     Status: None   Collection Time: 11/25/22 10:41 PM  Result Value Ref Range   Lactic Acid, Venous 0.7 0.5 - 1.9 mmol/L    Comment: Performed at [facility], [address] [place], [place] [address]   CT ABDOMEN PELVIS W CONTRAST  Result Date: 11/25/2022 CLINICAL DATA:  Status post abdominal tumor removal. No bowel movement for several days or passing gas. Abdominal distention with severe pain and clinical concern for bowel obstruction. EXAM: CT ABDOMEN AND PELVIS WITH CONTRAST TECHNIQUE: Multidetector CT imaging of the abdomen and pelvis was performed using the standard protocol following bolus administration of intravenous contrast. RADIATION DOSE REDUCTION: This exam was performed according to the departmental dose-optimization program which includes automated exposure control, adjustment of the mA and/or kV according to patient size and/or use of iterative reconstruction technique. CONTRAST:  OMNIPAQUE IOHEXOL 300 MG/ML  SOLN COMPARISON:  10/16/2022. FINDINGS: Lower chest: Coronary artery calcifications are noted. There is a 5 mm nodule in the right lower lobe, axial image 5, unchanged from the prior exam. There is a 4 mm nodule in the right lower lobe, axial image 19. There is a 6 mm nodule in the left  lower lobe, axial image 9. A 7 mm nodule is present in the left lower lobe, axial image 13. Hepatobiliary: No focal liver abnormality is seen. Status post cholecystectomy. No  biliary dilatation. Pancreas: Unremarkable. No pancreatic ductal dilatation or surrounding inflammatory changes. Spleen: Normal in size without focal abnormality. Adrenals/Urinary Tract: The adrenal glands are within normal limits. The kidneys enhance symmetrically. A cyst is noted in the upper pole of the right kidney. No renal calculus or obstructive uropathy bilaterally. The bladder is unremarkable. Stomach/Bowel: There is a small hiatal hernia. Stomach is within normal limits. Appendix is not seen. No evidence of bowel wall thickening, distention, or inflammatory changes. No free air or pneumatosis. There is a ventral abdominal wall hernia in the upper abdomen containing a small portion nonobstructed colon. A large amount of retained stool is present in the rectum. There is inferior displacement of the rectum below the pubococcygeal line compatible with pelvic floor dysfunction. Scattered diverticula are present along the colon without evidence of diverticulitis. Vascular/Lymphatic: Aortic atherosclerosis. Nonspecific prominent lymph nodes are present at the porta hepatis. Hazy attenuation is noted in the mesenteric root, unchanged. Reproductive: Status post hysterectomy. No adnexal masses. Other: No abdominopelvic ascites. Small amount of edema is noted in the presacral space. Musculoskeletal: Degenerative changes are present in the thoracolumbar spine. No acute or suspicious osseous abnormality is seen. IMPRESSION: 1. No evidence of bowel obstruction. 2. Moderate-to-large amount of retained stool in the rectum. There are findings compatible with pelvic floor dysfunction which may be associated with constipation. 3. Ventral abdominal wall hernia containing nonobstructed colon. 4. Multiple scattered pulmonary nodules at the lung bases  measuring up to 7 mm, not significantly changed from 2022. 5. Aortic atherosclerosis. 6. Small hiatal hernia. 7. Diverticulosis without diverticulitis. Electronically Signed   By: Thornell Sartorius M.D.   On: 11/25/2022 23:27    Pending Labs Unresulted Labs (From admission, onward)     Start     Ordered   11/25/22 2157  Urinalysis, Routine w reflex microscopic -Urine, Clean Catch  ONCE - URGENT,   URGENT       Question:  Specimen Source  Answer:  Urine, Clean Catch   11/25/22 2156            Vitals/Pain Today's Vitals   11/26/22 0219 11/26/22 0330 11/26/22 0407 11/26/22 0414  BP: (!) 140/76  103/89 103/89  Pulse: 88  86 86  Resp: 18   18  Temp: 98.4 F (36.9 C)   98.7 F (37.1 C)  TempSrc: Oral     SpO2: 98%  97% 98%  Weight:      Height:      PainSc:  10-Worst pain ever  8     Isolation Precautions No active isolations  Medications Medications  lactated ringers infusion ( Intravenous New Bag/Given 11/26/22 0406)  morphine (PF) 2 MG/ML injection 2 mg (2 mg Intravenous Given 11/25/22 2236)  sodium chloride 0.9 % bolus 500 mL (500 mLs Intravenous New Bag/Given 11/25/22 2311)  iohexol (OMNIPAQUE) 300 MG/ML solution 100 mL (100 mLs Intravenous Contrast Given 11/25/22 2256)  sodium phosphate (FLEET) 7-19 GM/118ML enema 1 enema (1 enema Rectal Given 11/26/22 0023)  ALPRAZolam Prudy Feeler) tablet 1 mg (1 mg Oral Given 11/26/22 0401)    Mobility walks with device - cane     Focused Assessments GI   R Recommendations: See Admitting Provider Note  Report given to:   Additional Notes: Pt still has not had a BM - only a little here and there.

## 2022-11-26 NOTE — ED Notes (Signed)
Soap suds enema administered by EDP with this RN. Pt placed on BSC to try for results.

## 2022-11-26 NOTE — ED Notes (Signed)
[facility] called for transport. 

## 2022-11-26 NOTE — H&P (Signed)
History and Physical  Alicia Bean UJW:[id number] DOB: Sep 11, 1940 DOA: 11/25/2022  PCP: Jearld Lesch, MD   Chief Complaint: abd pain   HPI: Alicia Bean is a 82 y.o. female with medical history significant for SBO, non-insulin-dependent type 2 diabetes, hypothyroidism being admitted to the hospital with abdominal pain due to severe constipation.  Patient presented with 4 days of abdominal pain, bloating, felt like when she had a bowel obstruction in the past.  Last bowel movement was 4 days ago.  In the emergency department at [facility], CT scan revealed constipation.  EDP attempted manual disimpaction x 2, p.o. MiraLAX x 1, fleets enema and soapsuds enema without any relief.  Patient was then admitted to the hospitalist service for further management.  Patient was seen and examined on the MedSurg floor after transfer, she is resting quietly, but looks somewhat uncomfortable.  States that she has a little bit of vertigo which is chronic for, and diffuse abdominal pain.  Denies nausea, or fevers.  Denies any relief of her symptoms since presenting to the ER last night.  Review of Systems: Please see HPI for pertinent positives and negatives. A complete 10 system review of systems are otherwise negative.  Past Medical History:  Diagnosis Date   Anxiety    Bowel obstruction (HCC)    Diabetes mellitus without complication (HCC)    Elevated cholesterol    Hypertension    Pancreatitis    Thyroid disease    TIA (transient ischemic attack)    Vertigo    Past Surgical History:  Procedure Laterality Date   ABDOMINAL HYSTERECTOMY     APPENDECTOMY     CHOLECYSTECTOMY     TONSILLECTOMY     vertigo      Social History:  reports that she has never smoked. She has never used smokeless tobacco. She reports that she does not drink alcohol and does not use drugs.   Allergies  Allergen Reactions   Prednisone Shortness Of Breath   Magnesium Sulfate Other (See Comments)    Low B/P   Fentanyl     Glycerin Other (See Comments)    Unknown   Isosorbide    Metformin And Related    Nitroglycerin    Propoxyphene Other (See Comments)    Unknown.   Versed [Midazolam]     History reviewed. No pertinent family history.   Prior to Admission medications   Medication Sig Start Date End Date Taking? Authorizing Provider  omeprazole (PRILOSEC) 40 MG capsule Take 1 capsule by mouth daily. 08/17/22  Yes [provider]  polyethylene glycol (MIRALAX) 17 g packet Take 17 g by mouth daily. 11/25/22  Yes Long, Arlyss Repress, MD  senna-docusate (SENOKOT-S) 8.6-50 MG tablet Take 1 tablet by mouth at bedtime as needed for mild constipation or moderate constipation. 11/25/22  Yes Long, Arlyss Repress, MD  ACCU-CHEK AVIVA PLUS test strip  02/16/15   [provider]  ACCU-CHEK SOFTCLIX LANCETS lancets  01/12/15   [provider]  ALPRAZolam Prudy Feeler) 1 MG tablet Take 1 mg by mouth at bedtime as needed for sleep.    [provider]  amLODipine (NORVASC) 5 MG tablet Take 5 mg by mouth daily.    [provider]  atenolol (TENORMIN) 25 MG tablet  02/16/15   [provider]  bisoprolol-hydrochlorothiazide (ZIAC) 10-6.25 MG tablet Take 1 tablet by mouth daily.    [provider]  citalopram (CELEXA) 40 MG tablet Take 40 mg by mouth daily.    [provider]  CORTISPORIN-TC 3.09-16-08-0.5 MG/ML otic suspension  02/12/15   [provider]  fexofenadine (ALLEGRA ALLERGY) 60 MG tablet Take 60 mg by mouth 2 (two) times daily.    [provider]  glimepiride (AMARYL) 2 MG tablet  03/15/15   [provider]  levothyroxine (SYNTHROID, LEVOTHROID) 100 MCG tablet Take 100 mcg by mouth daily before breakfast.    [provider]  meclizine (ANTIVERT) 25 MG tablet Take 25 mg by mouth 3 (three) times daily.    [provider]  memantine (NAMENDA) 5 MG tablet Take 5 mg by mouth 2 (two) times daily.    [provider]   oseltamivir (TAMIFLU) 75 MG capsule Take 1 capsule (75 mg total) by mouth every 12 (twelve) hours. 08/22/16   Phillis Haggis, MD  simvastatin (ZOCOR) 80 MG tablet Take 80 mg by mouth at bedtime.    [provider]    Physical Exam: BP (!) 143/70 (BP Location: Left Arm)   Pulse 89   Temp 98.5 F (36.9 C) (Oral)   Resp 18   Ht 5\' 2"  (1.575 m)   Wt 71.3 kg   SpO2 100%   BMI 28.75 kg/m   General:  Alert, oriented, calm, in no acute distress  Eyes: EOMI, clear conjuctivae, white sclerea Neck: supple, no masses, trachea mildline  Cardiovascular: RRR, no murmurs or rubs, no peripheral edema  Respiratory: clear to auscultation bilaterally, no wheezes, no crackles  Abdomen: soft, diffusely tender, nondistended, normal bowel tones heard  Skin: dry, no rashes  Musculoskeletal: no joint effusions, normal range of motion  Psychiatric: appropriate affect, normal speech  Neurologic: extraocular muscles intact, clear speech, moving all extremities with intact sensorium          Labs on Admission:  Basic Metabolic Panel: Recent Labs  Lab 11/25/22 2158  NA 127*  K 4.0  CL 94*  CO2 22  GLUCOSE 107*  BUN 13  CREATININE 1.08*  CALCIUM 8.9  MG 1.6*   Liver Function Tests: Recent Labs  Lab 11/25/22 2158  AST 24  ALT 15  ALKPHOS 64  BILITOT 0.5  PROT 7.9  ALBUMIN 4.4   Recent Labs  Lab 11/25/22 2158  LIPASE 40   No results for input(s): "AMMONIA" in the last 168 hours. CBC: Recent Labs  Lab 11/25/22 2158  WBC 8.3  NEUTROABS 5.6  HGB 10.3*  HCT 29.5*  MCV 90.2  PLT 238   Cardiac Enzymes: No results for input(s): "CKTOTAL", "CKMB", "CKMBINDEX", "TROPONINI" in the last 168 hours.  BNP (last 3 results) No results for input(s): "BNP" in the last 8760 hours.  ProBNP (last 3 results) No results for input(s): "PROBNP" in the last 8760 hours.  CBG: Recent Labs  Lab 11/26/22 0413  GLUCAP 189*    Radiological Exams on Admission: CT ABDOMEN PELVIS W  CONTRAST  Result Date: 11/25/2022 CLINICAL DATA:  Status post abdominal tumor removal. No bowel movement for several days or passing gas. Abdominal distention with severe pain and clinical concern for bowel obstruction. EXAM: CT ABDOMEN AND PELVIS WITH CONTRAST TECHNIQUE: Multidetector CT imaging of the abdomen and pelvis was performed using the standard protocol following bolus administration of intravenous contrast. RADIATION DOSE REDUCTION: This exam was performed according to the departmental dose-optimization program which includes automated exposure control, adjustment of the mA and/or kV according to patient size and/or use of iterative reconstruction technique. CONTRAST:  OMNIPAQUE IOHEXOL 300 MG/ML  SOLN COMPARISON:  10/16/2022. FINDINGS: Lower chest: Coronary  artery calcifications are noted. There is a 5 mm nodule in the right lower lobe, axial image 5, unchanged from the prior exam. There is a 4 mm nodule in the right lower lobe, axial image 19. There is a 6 mm nodule in the left lower lobe, axial image 9. A 7 mm nodule is present in the left lower lobe, axial image 13. Hepatobiliary: No focal liver abnormality is seen. Status post cholecystectomy. No biliary dilatation. Pancreas: Unremarkable. No pancreatic ductal dilatation or surrounding inflammatory changes. Spleen: Normal in size without focal abnormality. Adrenals/Urinary Tract: The adrenal glands are within normal limits. The kidneys enhance symmetrically. A cyst is noted in the upper pole of the right kidney. No renal calculus or obstructive uropathy bilaterally. The bladder is unremarkable. Stomach/Bowel: There is a small hiatal hernia. Stomach is within normal limits. Appendix is not seen. No evidence of bowel wall thickening, distention, or inflammatory changes. No free air or pneumatosis. There is a ventral abdominal wall hernia in the upper abdomen containing a small portion nonobstructed colon. A large amount of retained stool is  present in the rectum. There is inferior displacement of the rectum below the pubococcygeal line compatible with pelvic floor dysfunction. Scattered diverticula are present along the colon without evidence of diverticulitis. Vascular/Lymphatic: Aortic atherosclerosis. Nonspecific prominent lymph nodes are present at the porta hepatis. Hazy attenuation is noted in the mesenteric root, unchanged. Reproductive: Status post hysterectomy. No adnexal masses. Other: No abdominopelvic ascites. Small amount of edema is noted in the presacral space. Musculoskeletal: Degenerative changes are present in the thoracolumbar spine. No acute or suspicious osseous abnormality is seen. IMPRESSION: 1. No evidence of bowel obstruction. 2. Moderate-to-large amount of retained stool in the rectum. There are findings compatible with pelvic floor dysfunction which may be associated with constipation. 3. Ventral abdominal wall hernia containing nonobstructed colon. 4. Multiple scattered pulmonary nodules at the lung bases measuring up to 7 mm, not significantly changed from 2022. 5. Aortic atherosclerosis. 6. Small hiatal hernia. 7. Diverticulosis without diverticulitis. Electronically Signed   By: Thornell Sartorius M.D.   On: 11/25/2022 23:27    Assessment/Plan 82 year old female with a history of non-insulin-dependent type 2 diabetes, prior small bowel obstruction admitted to the hospital with severe constipation. -Observation admission -Clear liquid diet for now -Mag citrate x 1 p.o. now -MiraLAX p.o. twice daily, Dulcolax daily -Discussed the above plan with [facility] on-call, can plan for formal consultation in the morning if unsuccessful  Non-insulin-dependent type 2 diabetes -Clear liquid diet for now, advance as she improves to diabetic diet -Moderate dose sliding scale insulin  DVT prophylaxis: Lovenox     Code Status: Full Code  Consults called: None  Admission status: Observation  Time spent: 38 minutes  Hezzie Karim Sharlette Dense MD [facility] Pager [phone]  If 7PM-7AM, please contact night-coverage [email or web address] Password [facility]  11/26/2022, 8:13 AM

## 2022-11-26 NOTE — Plan of Care (Signed)

## 2022-11-26 NOTE — ED Notes (Addendum)
Call Harvie Heck - son [phone] when arrives to [facility]

## 2022-11-27 DIAGNOSIS — K5900 Constipation, unspecified: Secondary | ICD-10-CM

## 2022-11-27 LAB — CBC
HCT: 28.6 % — ABNORMAL LOW (ref 36.0–46.0)
Hemoglobin: 9.6 g/dL — ABNORMAL LOW (ref 12.0–15.0)
MCH: 32 pg (ref 26.0–34.0)
MCHC: 33.6 g/dL (ref 30.0–36.0)
MCV: 95.3 fL (ref 80.0–100.0)
Platelets: 203 10*3/uL (ref 150–400)
RBC: 3 MIL/uL — ABNORMAL LOW (ref 3.87–5.11)
RDW: 13.2 % (ref 11.5–15.5)
WBC: 5.2 10*3/uL (ref 4.0–10.5)
nRBC: 0 % (ref 0.0–0.2)

## 2022-11-27 LAB — BASIC METABOLIC PANEL
Anion gap: 8 (ref 5–15)
BUN: 8 mg/dL (ref 8–23)
CO2: 22 mmol/L (ref 22–32)
Calcium: 8.3 mg/dL — ABNORMAL LOW (ref 8.9–10.3)
Chloride: 98 mmol/L (ref 98–111)
Creatinine, Ser: 0.89 mg/dL (ref 0.44–1.00)
GFR, Estimated: >60 mL/min (ref >60)
Glucose, Bld: 132 mg/dL — ABNORMAL HIGH (ref 70–99)
Potassium: 3.8 mmol/L (ref 3.5–5.1)
Sodium: 128 mmol/L — ABNORMAL LOW (ref 135–145)

## 2022-11-27 LAB — GLUCOSE, CAPILLARY
Glucose-Capillary: 144 mg/dL — ABNORMAL HIGH (ref 70–99)
Glucose-Capillary: 148 mg/dL — ABNORMAL HIGH (ref 70–99)
Glucose-Capillary: 169 mg/dL — ABNORMAL HIGH (ref 70–99)

## 2022-11-27 NOTE — TOC Initial Note (Signed)
Transition of Care [facility]) - Initial/Assessment Note    Patient Details  Name: Alicia Bean MRN: [id number] Date of Birth: [birth year]  [facility]) CM/SW Contact:    Durenda Guthrie, RN Phone Number: 11/27/2022, 10:25 AM  Clinical Narrative:                 Transition of Care [facility]) Department has reviewed patient and no TOC needs have been identified at this time. We will continue to monitor patient advancement through Interdisciplinary progressions and if new patient needs arise, please place a consult.  Expected Discharge Plan: Home/Self Care     Patient Goals and CMS Choice            Expected Discharge Plan and Services                                              Prior Living Arrangements/Services                       Activities of Daily Living Home Assistive Devices/Equipment: Cane (specify quad or straight) ADL Screening (condition at time of admission) Patient's cognitive ability adequate to safely complete daily activities?: No Is the patient deaf or have difficulty hearing?: No Does the patient have difficulty seeing, even when wearing glasses/contacts?: No Does the patient have difficulty concentrating, remembering, or making decisions?: Yes Patient able to express need for assistance with ADLs?: Yes Does the patient have difficulty dressing or bathing?: No Independently performs ADLs?: Yes (appropriate for developmental age) Does the patient have difficulty walking or climbing stairs?: No Weakness of Legs: None Weakness of Arms/Hands: None  Permission Sought/Granted                  Emotional Assessment              Admission diagnosis:  Generalized abdominal pain [R10.84] Constipation [K59.00] Constipation, unspecified constipation type [K59.00] Patient Active Problem List   Diagnosis Date Noted   Constipation 11/26/2022   Diabetes mellitus (HCC) 11/11/2015   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   BP (high  blood pressure) 11/11/2015   Below normal amount of sodium in the blood 11/11/2015   Pelvic mass in female 03/25/2015   Mass of pelvis 03/25/2015   PCP:  Jearld Lesch, MD Pharmacy:   [facility] [place], [place] - [address] [place] [address] Phone: [phone] Fax: [phone]     Social Determinants of Health (SDOH) Social History: SDOH Screenings   Food Insecurity: No Food Insecurity (11/26/2022)  Housing: Low Risk  (11/26/2022)  Transportation Needs: No Transportation Needs (11/26/2022)  Utilities: Not At Risk (11/26/2022)  Tobacco Use: Low Risk  (11/25/2022)   SDOH Interventions: Housing Interventions: Intervention Not Indicated   Readmission Risk Interventions     No data to display

## 2022-11-27 NOTE — Discharge Summary (Signed)
Physician Discharge Summary   Patient: Alicia Bean MRN: [id number] DOB: 1940-11-18  Admit date:     11/25/2022  Discharge date: 11/27/22  Discharge Physician: Harold Hedge   PCP: Jearld Lesch, MD   Recommendations at discharge:    Follow up with PCP in 1 week.  Discharge Diagnoses: Principal Problem:   Constipation Active Problems:   Diabetes mellitus (HCC)  Resolved Problems:   * No resolved hospital problems. *  Hospital Course:  Alicia Bean is a 82 y.o. female with medical history significant for SBO, non-insulin-dependent type 2 diabetes, hypothyroidism being admitted to the hospital with abdominal pain due to severe constipation. ER Physician attempted manual disimpaction x 2, p.o. MiraLAX x 1, fleets enema and soapsuds enema without any relief.  Patient was admitted by the hospitalist team and initiated on clear liquid diet, mag citrate and p.o. MiraLAX.  Following this patient has had 6 bowel movements abdominal pain has completely resolved patient was advanced in diet which she has tolerated well.  Patient is currently being discharged home needs to follow-up with PCP in 1 week.  Assessment and Plan: 82 year old female with a history of non-insulin-dependent type 2 diabetes, prior small bowel obstruction admitted to the hospital with severe constipation. -Observation admission -Clear liquid diet for now -Mag citrate x 1 p.o. now -MiraLAX p.o. twice daily, Dulcolax daily - Following this patient has had 6 bowel movements, abdominal pain resolved, patient tolerating diet well.  Non-insulin-dependent type 2 diabetes -Clear liquid diet for now, advance as she improves to diabetic diet -Moderate dose sliding scale insulin         Consultants: None Procedures performed: None  Disposition: Home Diet recommendation:  Carb modified diet DISCHARGE MEDICATION:   Follow-up Information     Jearld Lesch, MD. Call in 1 day.   Specialty: Family  Medicine Contact information: [address] [place] [address] [phone]         [facility] Emergency Department at [facility] .   Specialty: Emergency Medicine Why: If symptoms worsen Contact information: [address] [id number] [place] [address] [phone]        Jearld Lesch, MD Follow up in 1 week(s).   Specialty: Family Medicine Contact information: [address] [place] [address] [phone]                Discharge Exam: Alicia Bean Weights   11/25/22 1813 11/26/22 0624  Weight: 72.6 kg 71.3 kg   Patient is comfortable and alert CVS: S1-S2 positive Respiratory bilateral clear and equal breath sounds Abdomen: Soft nondistended nontender bowel sounds positive Condition at discharge: fair  The results of significant diagnostics from this hospitalization (including imaging, microbiology, ancillary and laboratory) are listed below for reference.   Imaging Studies: CT ABDOMEN PELVIS W CONTRAST  Result Date: 11/25/2022 CLINICAL DATA:  Status post abdominal tumor removal. No bowel movement for several days or passing gas. Abdominal distention with severe pain and clinical concern for bowel obstruction. EXAM: CT ABDOMEN AND PELVIS WITH CONTRAST TECHNIQUE: Multidetector CT imaging of the abdomen and pelvis was performed using the standard protocol following bolus administration of intravenous contrast. RADIATION DOSE REDUCTION: This exam was performed according to the departmental dose-optimization program which includes automated exposure control, adjustment of the mA and/or kV according to patient size and/or use of iterative reconstruction technique. CONTRAST:  OMNIPAQUE IOHEXOL 300 MG/ML  SOLN COMPARISON:  10/16/2022. FINDINGS: Lower chest: Coronary artery calcifications are noted. There is  a 5 mm nodule in the right lower lobe, axial image 5, unchanged from the prior exam. There is a 4 mm nodule in the  right lower lobe, axial image 19. There is a 6 mm nodule in the left lower lobe, axial image 9. A 7 mm nodule is present in the left lower lobe, axial image 13. Hepatobiliary: No focal liver abnormality is seen. Status post cholecystectomy. No biliary dilatation. Pancreas: Unremarkable. No pancreatic ductal dilatation or surrounding inflammatory changes. Spleen: Normal in size without focal abnormality. Adrenals/Urinary Tract: The adrenal glands are within normal limits. The kidneys enhance symmetrically. A cyst is noted in the upper pole of the right kidney. No renal calculus or obstructive uropathy bilaterally. The bladder is unremarkable. Stomach/Bowel: There is a small hiatal hernia. Stomach is within normal limits. Appendix is not seen. No evidence of bowel wall thickening, distention, or inflammatory changes. No free air or pneumatosis. There is a ventral abdominal wall hernia in the upper abdomen containing a small portion nonobstructed colon. A large amount of retained stool is present in the rectum. There is inferior displacement of the rectum below the pubococcygeal line compatible with pelvic floor dysfunction. Scattered diverticula are present along the colon without evidence of diverticulitis. Vascular/Lymphatic: Aortic atherosclerosis. Nonspecific prominent lymph nodes are present at the porta hepatis. Hazy attenuation is noted in the mesenteric root, unchanged. Reproductive: Status post hysterectomy. No adnexal masses. Other: No abdominopelvic ascites. Small amount of edema is noted in the presacral space. Musculoskeletal: Degenerative changes are present in the thoracolumbar spine. No acute or suspicious osseous abnormality is seen. IMPRESSION: 1. No evidence of bowel obstruction. 2. Moderate-to-large amount of retained stool in the rectum. There are findings compatible with pelvic floor dysfunction which may be associated with constipation. 3. Ventral abdominal wall hernia containing nonobstructed  colon. 4. Multiple scattered pulmonary nodules at the lung bases measuring up to 7 mm, not significantly changed from 2022. 5. Aortic atherosclerosis. 6. Small hiatal hernia. 7. Diverticulosis without diverticulitis. Electronically Signed   By: Thornell Sartorius M.D.   On: 11/25/2022 23:27    Microbiology: Results for orders placed or performed during the hospital encounter of 08/22/16  Blood culture (routine x 2)     Status: None   Collection Time: 08/22/16 10:50 AM   Specimen: BLOOD  Result Value Ref Range Status   Specimen Description BLOOD RIGHT ARM  Final   Special Requests BOTTLES DRAWN AEROBIC AND ANAEROBIC 5CC  Final   Culture   Final    NO GROWTH 5 DAYS Performed at [facility], [address] [place], [place] [address]    Report Status 08/27/2016 FINAL  Final  Urine culture     Status: Abnormal   Collection Time: 08/22/16 11:17 AM   Specimen: Urine, Random  Result Value Ref Range Status   Specimen Description URINE, RANDOM  Final   Special Requests NONE  Final   Culture MULTIPLE SPECIES PRESENT, SUGGEST RECOLLECTION (A)  Final   Report Status 08/23/2016 FINAL  Final  Blood culture (routine x 2)     Status: None   Collection Time: 08/22/16 11:25 AM   Specimen: BLOOD  Result Value Ref Range Status   Specimen Description BLOOD LEFT WRIST  Final   Special Requests BOTTLES DRAWN AEROBIC AND ANAEROBIC 5CC  Final   Culture   Final    NO GROWTH 5 DAYS Performed at [facility], [address], [place], [place] [address]    Report Status 08/27/2016 FINAL  Final    Labs: CBC: Recent Labs  Lab 11/25/22 2158 11/27/22 0448  WBC 8.3 5.2  NEUTROABS 5.6  --   HGB 10.3* 9.6*  HCT 29.5* 28.6*  MCV 90.2 95.3  PLT 238 203   Basic Metabolic Panel: Recent Labs  Lab 11/25/22 2158 11/27/22 0448  NA 127* 128*  K 4.0 3.8  CL 94* 98  CO2 22 22  GLUCOSE 107* 132*  BUN 13 8  CREATININE 1.08* 0.89  CALCIUM 8.9 8.3*  MG 1.6*  --    Liver Function Tests: Recent  Labs  Lab 11/25/22 2158  AST 24  ALT 15  ALKPHOS 64  BILITOT 0.5  PROT 7.9  ALBUMIN 4.4   CBG: Recent Labs  Lab 11/26/22 1235 11/26/22 1730 11/26/22 2106 11/26/22 2202 11/27/22 0724  GLUCAP 144* 165* 75 130* 148*    Discharge time spent: greater than 30 minutes.  Signed: Harold Hedge, MD [facility] 11/27/2022

## 2024-05-17 ENCOUNTER — Emergency Department (HOSPITAL_BASED_OUTPATIENT_CLINIC_OR_DEPARTMENT_OTHER)
Admission: EM | Admit: 2024-05-17 | Discharge: 2024-05-17 | Disposition: A | Discharge status: Home / Self Care | Attending: Emergency Medicine | Admitting: Emergency Medicine

## 2024-05-17 ENCOUNTER — Encounter (HOSPITAL_BASED_OUTPATIENT_CLINIC_OR_DEPARTMENT_OTHER): Payer: Self-pay | Admitting: Emergency Medicine

## 2024-05-17 ENCOUNTER — Emergency Department (HOSPITAL_BASED_OUTPATIENT_CLINIC_OR_DEPARTMENT_OTHER)

## 2024-05-17 DIAGNOSIS — K5641 Fecal impaction: Secondary | ICD-10-CM | POA: Diagnosis present

## 2024-05-17 LAB — COMPREHENSIVE METABOLIC PANEL WITH GFR
ALT: 16 U/L (ref 0–44)
AST: 23 U/L (ref 15–41)
Albumin: 4.2 g/dL (ref 3.5–5.0)
Alkaline Phosphatase: 71 U/L (ref 38–126)
Anion gap: 17 — ABNORMAL HIGH (ref 5–15)
BUN: 28 mg/dL — ABNORMAL HIGH (ref 8–23)
CO2: 20 mmol/L — ABNORMAL LOW (ref 22–32)
Calcium: 9.4 mg/dL (ref 8.9–10.3)
Chloride: 100 mmol/L (ref 98–111)
Creatinine, Ser: 1.66 mg/dL — ABNORMAL HIGH (ref 0.44–1.00)
GFR, Estimated: 30 mL/min — ABNORMAL LOW (ref >60)
Glucose, Bld: 105 mg/dL — ABNORMAL HIGH (ref 70–99)
Potassium: 3.4 mmol/L — ABNORMAL LOW (ref 3.5–5.1)
Sodium: 138 mmol/L (ref 135–145)
Total Bilirubin: 0.5 mg/dL (ref 0.0–1.2)
Total Protein: 7.2 g/dL (ref 6.5–8.1)

## 2024-05-17 LAB — CBC WITH DIFFERENTIAL/PLATELET
Abs Immature Granulocytes: 0.02 K/uL (ref 0.00–0.07)
Basophils Absolute: 0.1 K/uL (ref 0.0–0.1)
Basophils Relative: 1 %
Eosinophils Absolute: 0.1 K/uL (ref 0.0–0.5)
Eosinophils Relative: 2 %
HCT: 32.8 % — ABNORMAL LOW (ref 36.0–46.0)
Hemoglobin: 11.6 g/dL — ABNORMAL LOW (ref 12.0–15.0)
Immature Granulocytes: 0 %
Lymphocytes Relative: 24 %
Lymphs Abs: 1.8 K/uL (ref 0.7–4.0)
MCH: 31.1 pg (ref 26.0–34.0)
MCHC: 35.4 g/dL (ref 30.0–36.0)
MCV: 87.9 fL (ref 80.0–100.0)
Monocytes Absolute: 0.7 K/uL (ref 0.1–1.0)
Monocytes Relative: 9 %
Neutro Abs: 4.8 K/uL (ref 1.7–7.7)
Neutrophils Relative %: 64 %
Platelets: 224 K/uL (ref 150–400)
RBC: 3.73 MIL/uL — ABNORMAL LOW (ref 3.87–5.11)
RDW: 12.3 % (ref 11.5–15.5)
WBC: 7.5 K/uL (ref 4.0–10.5)
nRBC: 0 % (ref 0.0–0.2)

## 2024-05-17 LAB — LACTIC ACID, PLASMA
Lactic Acid, Venous: 2.1 mmol/L (ref 0.5–1.9)
Lactic Acid, Venous: 1.2 mmol/L (ref 0.5–1.9)

## 2024-05-17 LAB — LIPASE, BLOOD: Lipase: 28 U/L (ref 11–51)

## 2024-05-17 MED ORDER — FLEET ENEMA RE ENEM
1.0000 | ENEMA | Freq: Once | RECTAL | Status: AC
  Administered 2024-05-17: 1 via RECTAL
  Filled 2024-05-17: qty 1

## 2024-05-17 MED ORDER — IOHEXOL 300 MG/ML  SOLN
70.0000 mL | Freq: Once | INTRAMUSCULAR | Status: AC | PRN
  Administered 2024-05-17: 70 mL via INTRAVENOUS

## 2024-05-17 MED ORDER — FLEET ENEMA 7-19 GM/197ML RE ENEM: 1.0000 | ENEMA | Freq: Every day | RECTAL | 0 refills | Status: AC

## 2024-05-17 MED ORDER — SODIUM CHLORIDE 0.9 % IV BOLUS
1000.0000 mL | Freq: Once | INTRAVENOUS | Status: AC
  Administered 2024-05-17: 1000 mL via INTRAVENOUS

## 2024-05-17 MED ORDER — LACTULOSE 10 G PO PACK: 10.0000 g | PACK | Freq: Two times a day (BID) | ORAL | 0 refills | Status: AC

## 2024-05-17 NOTE — ED Provider Notes (Signed)
 [facility] AT [facility] Provider Note   CSN: [id number] Arrival date & time: 05/17/24  1438     Patient presents with: Fecal Impaction   Alicia Bean is a 83 y.o. female.  With a history ofConstipation, bowel obstruction and status post abdominal hysterectomy cholecystectomy appendectomy who presents to ED for constipation.  Patient has struggled with constipation in the past.  Last normal bowel movement was 4 to 5 days ago.  Since that time she has had abdominal fullness and only passing small amount of liquids despite at home laxative and enema use.  She attempted to disimpact herself and was able to get a small amount of stool but does feel as though stool is impacted as it has been in the past.  Some associated nausea and decreased appetite.  No vomiting fevers chills respiratory symptoms.   HPI     Prior to Admission medications   Medication Sig Start Date End Date Taking? Authorizing Provider  lactulose (CEPHULAC) 10 g packet Take 1 packet (10 g total) by mouth 2 (two) times daily. 05/17/24  Yes Pamella Ozell LABOR, DO  Sodium Phosphates  (FLEET ENEMA) 7-19 GM/197ML ENEM Place 1 each rectally daily for 5 days. 05/17/24 05/22/24 Yes Pamella Ozell LABOR, DO  ACCU-CHEK AVIVA PLUS test strip  02/16/15   [provider]  ACCU-CHEK SOFTCLIX LANCETS lancets  01/12/15   [provider]  ALPRAZolam  (XANAX ) 1 MG tablet Take 1 mg by mouth 2 (two) times daily.    [provider]  amLODipine (NORVASC) 5 MG tablet Take 5 mg by mouth daily.    [provider]  bisoprolol-hydrochlorothiazide (ZIAC) 10-6.25 MG tablet Take 1 tablet by mouth daily.    [provider]  citalopram (CELEXA) 40 MG tablet Take 40 mg by mouth daily.    [provider]  fexofenadine (ALLEGRA ALLERGY) 60 MG tablet Take 60 mg by mouth daily.    [provider]  glimepiride (AMARYL) 2 MG tablet Take 2 mg by mouth daily. 03/15/15   [provider]  lactose free nutrition (BOOST) LIQD Take 237 mLs by mouth daily.    [provider]  levothyroxine (SYNTHROID, LEVOTHROID) 100 MCG tablet Take 100 mcg by mouth daily before breakfast.    [provider]  meclizine  (ANTIVERT ) 25 MG tablet Take 25 mg by mouth 3 (three) times daily.    [provider]  memantine (NAMENDA) 5 MG tablet Take 5 mg by mouth 2 (two) times daily.    [provider]  omeprazole (PRILOSEC) 40 MG capsule Take 1 capsule by mouth daily. 08/17/22   [provider]  polyethylene glycol (MIRALAX ) 17 g packet Take 17 g by mouth daily. 11/25/22   Long, Fonda MATSU, MD  senna-docusate (SENOKOT-S) 8.6-50 MG tablet Take 1 tablet by mouth at bedtime as needed for mild constipation or moderate constipation. 11/25/22   Long, Joshua G, MD  simvastatin (ZOCOR) 80 MG tablet Take 80 mg by mouth at bedtime.    [provider]    Allergies: Prednisone, Magnesium  sulfate, Fentanyl, Glycerin, Isosorbide, Metformin and related, Nitroglycerin, Propoxyphene, and Versed [midazolam]    Review of Systems  Updated Vital Signs BP (!) 118/58 (BP Location: Left Arm)   Pulse 81   Temp 98 F (36.7 C) (Oral)   Resp 16   Wt 69.9 kg   SpO2 97%   BMI 28.17 kg/m   Physical Exam Vitals and nursing note reviewed.  HENT:  Head: Normocephalic and atraumatic.  Eyes:     Pupils: Pupils are equal, round, and reactive to light.  Cardiovascular:     Rate and Rhythm: Normal rate and regular rhythm.  Pulmonary:     Effort: Pulmonary effort is normal.     Breath sounds: Normal breath sounds.  Abdominal:     Palpations: Abdomen is soft.     Tenderness: There is abdominal tenderness. There is no guarding or rebound.     Comments: Mild diffuse abdominal tenderness without rebound rigidity or guarding  Skin:    General: Skin is warm and dry.  Neurological:     Mental Status: She is alert.  Psychiatric:        Mood and Affect: Mood normal.      (all labs ordered are listed, but only abnormal results are displayed) Labs Reviewed  COMPREHENSIVE METABOLIC PANEL WITH GFR - Abnormal; Notable for the following components:      Result Value   Potassium 3.4 (*)    CO2 20 (*)    Glucose, Bld 105 (*)    BUN 28 (*)    Creatinine, Ser 1.66 (*)    GFR, Estimated 30 (*)    Anion gap 17 (*)    All other components within normal limits  CBC WITH DIFFERENTIAL/PLATELET - Abnormal; Notable for the following components:   RBC 3.73 (*)    Hemoglobin 11.6 (*)    HCT 32.8 (*)    All other components within normal limits  LACTIC ACID, PLASMA - Abnormal; Notable for the following components:   Lactic Acid, Venous 2.1 (*)    All other components within normal limits  LIPASE, BLOOD  LACTIC ACID, PLASMA    EKG: None  Radiology: CT ABDOMEN PELVIS W CONTRAST Result Date: 05/17/2024 EXAM: CT ABDOMEN AND PELVIS WITH CONTRAST 05/17/2024 05:34:00 PM TECHNIQUE: CT of the abdomen and pelvis was performed with the administration of 70 mL of iohexol  (OMNIPAQUE ) 300 MG/ML solution. Multiplanar reformatted images are provided for review. Automated exposure control, iterative reconstruction, and/or weight-based adjustment of the mA/kV was utilized to reduce the radiation dose to as low as reasonably achievable. COMPARISON: 12/19/2022 CLINICAL HISTORY: Constipation, abdominal pain. FINDINGS: LOWER CHEST: 5 mm left lower lobe pulmonary nodule is seen and is stable dating back to a study from 2017, compatible with a benign nodule. 3 mm right lower lobe pulmonary nodule on image 37 is stable since prior study most compatible with benign nodule. Small hiatal hernia. No effusions. LIVER: The liver is unremarkable. GALLBLADDER AND BILE DUCTS: Prior cholecystectomy. No biliary ductal dilatation. SPLEEN: No acute abnormality. PANCREAS: No acute abnormality. ADRENAL GLANDS: No acute abnormality. KIDNEYS, URETERS AND BLADDER: No stones in the kidneys or ureters. No  hydronephrosis. No perinephric or periureteral stranding. Urinary bladder is unremarkable. GI AND BOWEL: Stomach demonstrates no acute abnormality. Small hiatal hernia. Large stool burden in the rectum concerning for fecal impaction. Moderate stool burden throughout the remainder of the colon. There is no bowel obstruction. PERITONEUM AND RETROPERITONEUM: No ascites. No free air. VASCULATURE: Aorta is normal in caliber. Diffuse aortic atherosclerosis. LYMPH NODES: No lymphadenopathy. REPRODUCTIVE ORGANS: Prior hysterectomy. BONES AND SOFT TISSUES: No acute osseous abnormality. No focal soft tissue abnormality. IMPRESSION: 1. Large stool burden in the rectum concerning for fecal impaction. 2. Moderate stool burden throughout the remainder of the colon. Electronically signed by: Franky Crease MD 05/17/2024 05:45 PM EDT RP Workstation: [id number]     .Fecal disimpaction  Date/Time: 05/17/2024 7:07 PM  Performed by:  Pamella Ozell LABOR, DO Authorized by: Pamella Ozell LABOR, DO  Consent: Verbal consent obtained Consent given by: patient Time out: Immediately prior to procedure a time out was called to verify the correct patient, procedure, equipment, support staff and site/side marked as required. Local anesthesia used: no  Anesthesia: Local anesthesia used: no  Sedation: Patient sedated: no  Comments: Left lateral decubitus position.  Hard stool ball appreciated at anal verge in rectal vault.  Loosened with moderate amount of stool removed.  Patient tolerated procedure well   .Fecal disimpaction  Date/Time: 05/17/2024 8:58 PM  Performed by: Pamella Ozell LABOR, DO Authorized by: Pamella Ozell LABOR, DO  Consent: Verbal consent obtained Consent given by: patient Time out: Immediately prior to procedure a time out was called to verify the correct patient, procedure, equipment, support staff and site/side marked as required. Comments: Right lateral decubitus position.  Patient tolerated procedure well.   Small amount of hard stool removed and loosening of impaction      Medications Ordered in the ED  sodium chloride  0.9 % bolus 1,000 mL (0 mLs Intravenous Stopped 05/17/24 2019)  iohexol  (OMNIPAQUE ) 300 MG/ML solution 70 mL (70 mLs Intravenous Contrast Given 05/17/24 1727)  sodium phosphate (FLEET) enema 1 enema (1 enema Rectal Given 05/17/24 1917)    Clinical Course as of 05/17/24 2059  Sat May 17, 2024  1824 No leukocytosis.  Labs notable for AKI and slight elevation in venous lactic.  Will provide IV fluids and repeat lactic.  CT consistent with fecal impaction.  Will attempt manual disimpaction here. [MP]  1908 Fecal disimpaction completed with removal of moderate amount of stool.  Patient will attempt bowel movement here.  Will provide her with enema and hopefully transit will be improved following that disimpaction [MP]  2053 Patient did have a small amount of watery bowel movements but feels as though there is another impaction.  Attempted a second fecal disimpaction was able to remove some stool but patient did not wish to continue with disimpaction as it was very uncomfortable for her which we appreciate.  Discussed with her son who wishes for her to go home tonight with bowel regimen in place.  Counseled him on MiraLAX  along with Fleet enema.  Lactulose has helped in the past and I will give him another prescription for lactulose to pick up in the pharmacy.  Patient will return to the emergency department for severe pain or if she is unable to have a bowel movement in the next 48 hours [MP]    Clinical Course User Index [MP] Pamella Ozell LABOR, DO                                 Medical Decision Making 83 year old female with history as above presented to the ED for abdominal pain constipation x 4 days.  History of breath fecal impaction and bowel obstruction.  At home laxative use has not been helpful.  Afebrile normotensive.  Some mild diffuse abdominal tenderness on my exam.  Will need  to evaluate for recurrent small bowel obstruction prior to attempting disimpaction.  Will obtain laboratory workup and CT abdomen pelvis.  If this is simply fecal impaction will attempt manual disimpaction here but if this is another bowel obstruction we will let surgery now and likely admit to medicine  Amount and/or Complexity of Data Reviewed Labs: ordered. Radiology: ordered.  Risk OTC drugs. Prescription drug management.  Final diagnoses:  Fecal impaction [facility])    ED Discharge Orders          Ordered    lactulose (CEPHULAC) 10 g packet  2 times daily        05/17/24 2057    Sodium Phosphates  (FLEET ENEMA) 7-19 GM/197ML ENEM  Daily        05/17/24 2057               Pamella Ozell LABOR, DO 05/17/24 2059

## 2024-05-17 NOTE — Discharge Instructions (Addendum)
 Alicia Bean was seen in the Emergency Department for fecal impaction The CAT scan did not show evidence of bowel obstruction but did show fecal impaction We attempted fecal disimpaction twice She was able to tolerate the procedure She wished to go home tonight Give MiraLAX  and try magnesium  citrate orally to help with constipation You can also try Fleet enemas which we have prescribed your pharmacy We have also refilled the prescription for lactulose to help with bowel movements as well and you can pick this up from your pharmacy She should return to the emergency department if she does not have a solid bowel movement in the next 48 hours Or return sooner for severe pain or any other concerns Otherwise follow-up with her PCP within 1 week for reevaluation

## 2024-05-17 NOTE — ED Notes (Signed)
 MD attempted again to disimpact patient.  Patient was unable to tolerate it long enough to get results. Stool is still hard.

## 2024-05-17 NOTE — ED Triage Notes (Signed)
 Pt bib son, pt c/o constipation 3-4 days and passing only small amt of stool, hx of fecal impaction. Endorses abd pain and rectal pressure
